# Patient Record
Sex: Male | Born: 1935 | Race: Black or African American | Hispanic: No | Smoking: Never smoker
Health system: Southern US, Community
[De-identification: ages and names within clinical notes are randomized; demographics above are authoritative.]

## PROBLEM LIST (undated history)

## (undated) DIAGNOSIS — K5190 Ulcerative colitis, unspecified, without complications: Secondary | ICD-10-CM

## (undated) DIAGNOSIS — J40 Bronchitis, not specified as acute or chronic: Secondary | ICD-10-CM

## (undated) DIAGNOSIS — N189 Chronic kidney disease, unspecified: Secondary | ICD-10-CM

## (undated) DIAGNOSIS — Z8719 Personal history of other diseases of the digestive system: Secondary | ICD-10-CM

## (undated) DIAGNOSIS — M1990 Unspecified osteoarthritis, unspecified site: Secondary | ICD-10-CM

## (undated) DIAGNOSIS — D481 Neoplasm of uncertain behavior of connective and other soft tissue: Secondary | ICD-10-CM

## (undated) DIAGNOSIS — D4819 Other specified neoplasm of uncertain behavior of connective and other soft tissue: Secondary | ICD-10-CM

## (undated) DIAGNOSIS — I10 Essential (primary) hypertension: Secondary | ICD-10-CM

## (undated) DIAGNOSIS — N4281 Prostatodynia syndrome: Secondary | ICD-10-CM

## (undated) HISTORY — PX: TOOTH EXTRACTION: SUR596

---

## 1998-05-02 ENCOUNTER — Emergency Department (HOSPITAL_COMMUNITY): Admission: EM | Admit: 1998-05-02 | Discharge: 1998-05-02 | Payer: Self-pay | Admitting: Emergency Medicine

## 1998-12-27 ENCOUNTER — Ambulatory Visit (HOSPITAL_COMMUNITY): Admission: RE | Admit: 1998-12-27 | Discharge: 1998-12-27 | Payer: Self-pay | Admitting: Gastroenterology

## 2000-04-21 ENCOUNTER — Ambulatory Visit (HOSPITAL_COMMUNITY): Admission: RE | Admit: 2000-04-21 | Discharge: 2000-04-21 | Payer: Self-pay | Admitting: Gastroenterology

## 2003-07-23 ENCOUNTER — Ambulatory Visit (HOSPITAL_COMMUNITY): Admission: RE | Admit: 2003-07-23 | Discharge: 2003-07-23 | Payer: Self-pay | Admitting: Internal Medicine

## 2003-07-23 ENCOUNTER — Encounter: Payer: Self-pay | Admitting: Internal Medicine

## 2005-08-07 ENCOUNTER — Ambulatory Visit (HOSPITAL_COMMUNITY): Admission: RE | Admit: 2005-08-07 | Discharge: 2005-08-07 | Payer: Self-pay | Admitting: Internal Medicine

## 2006-05-11 ENCOUNTER — Ambulatory Visit: Payer: Self-pay | Admitting: Gastroenterology

## 2008-12-20 ENCOUNTER — Encounter (INDEPENDENT_AMBULATORY_CARE_PROVIDER_SITE_OTHER): Payer: Self-pay | Admitting: *Deleted

## 2009-04-14 ENCOUNTER — Observation Stay (HOSPITAL_COMMUNITY): Admission: EM | Admit: 2009-04-14 | Discharge: 2009-04-15 | Payer: Self-pay | Admitting: Emergency Medicine

## 2009-04-28 ENCOUNTER — Inpatient Hospital Stay (HOSPITAL_COMMUNITY): Admission: EM | Admit: 2009-04-28 | Discharge: 2009-04-30 | Payer: Self-pay | Admitting: Emergency Medicine

## 2009-12-06 ENCOUNTER — Telehealth: Payer: Self-pay | Admitting: Gastroenterology

## 2010-01-30 ENCOUNTER — Ambulatory Visit: Payer: Self-pay | Admitting: Cardiology

## 2010-01-30 ENCOUNTER — Emergency Department (HOSPITAL_COMMUNITY): Admission: EM | Admit: 2010-01-30 | Discharge: 2010-02-01 | Payer: Self-pay | Admitting: Emergency Medicine

## 2010-01-30 ENCOUNTER — Inpatient Hospital Stay (HOSPITAL_COMMUNITY): Admission: EM | Admit: 2010-01-30 | Discharge: 2010-02-01 | Payer: Self-pay | Admitting: Emergency Medicine

## 2010-02-01 ENCOUNTER — Encounter (INDEPENDENT_AMBULATORY_CARE_PROVIDER_SITE_OTHER): Payer: Self-pay | Admitting: Internal Medicine

## 2010-02-20 ENCOUNTER — Telehealth: Payer: Self-pay | Admitting: Cardiology

## 2010-02-20 ENCOUNTER — Ambulatory Visit: Payer: Self-pay | Admitting: Cardiology

## 2010-02-20 DIAGNOSIS — R079 Chest pain, unspecified: Secondary | ICD-10-CM

## 2010-02-20 DIAGNOSIS — I10 Essential (primary) hypertension: Secondary | ICD-10-CM | POA: Insufficient documentation

## 2010-09-04 ENCOUNTER — Inpatient Hospital Stay (HOSPITAL_COMMUNITY): Admission: EM | Admit: 2010-09-04 | Discharge: 2010-09-06 | Payer: Self-pay | Admitting: Emergency Medicine

## 2010-09-04 ENCOUNTER — Ambulatory Visit: Payer: Self-pay | Admitting: Cardiology

## 2010-09-05 ENCOUNTER — Encounter (INDEPENDENT_AMBULATORY_CARE_PROVIDER_SITE_OTHER): Payer: Self-pay | Admitting: Internal Medicine

## 2010-11-06 NOTE — Progress Notes (Signed)
----   Converted from flag ---- ---- 02/20/2010 12:05 PM, Tera Mater, CNA wrote: 02/20/10 PATIENT WILL CALL WHEN READY TO [name]  ---- 02/20/2010 11:18 AM, Katina Dung, RN, BSN wrote: The following orders have been entered for this patient and placed on Admin Hold:  Type:     Referral       Code:   Nuc Stress Test Description:   Nuclear Stress Test Order Date:   02/20/2010   Authorized By:   Marca Ancona, MD Order #:   [id number] Clinical Notes:   _x__Exercise Stress ___Adenosine ___Lexiscan ___Dobutamine ___Myocardial Viability-Thallium  Prioity___1(next day) _x__2(one week) ___3 (prn)  __x__1 Day Protocol ____2 Day Protocol ------------------------------

## 2010-11-06 NOTE — Assessment & Plan Note (Signed)
Summary: NP6/History MI-mb   Primary Provider:  Dr. Margaretmary Bayley  CC:  new patient.  Pt is not sure that he has ever had heart problems.  .  History of Present Illness: 77 yo with history of HTN, borderline diabetes, and CKD presents for evaluation of chest pain.  Patient went to the ER in April with chest pain.  He felt "peculiar" all across his chest.  The sensation was somewhat pleuritic and was associated with a cough.  He had felt "bad" for 2-3 days prior.  Pain was not exertional.  It was somewhat prolonged (seems to have lasted for most of a day).  Cardiac enzymes, ECG, and CXR were unremarkable in the ER.  Since that time, patient has had on and off slight chest discomfort.  It is not exertional.  Patient does not report any significant exertional dyspnea.  He is able to walk 200 yards to his mailbox and back without problem.  Patient's BP is 159/82 today.  He did not take his metoprolol and says that he was nervous about coming here.  He takes BP at home and it consistently runs < 140/80.    Labs (4/11): creatinine 1.4, BNP < 30, cardiac enzymes negative, LDL 124, HDL 43, TGs 162  ECG: NSR, rSR' in V1 (incomplete RBBB)  Current Medications (verified): 1)  Centrum Silver  Tabs (Multiple Vitamins-Minerals) .... Once Daily 2)  Aspirin 81 Mg Tabs (Aspirin) .... Once Daily 3)  Metoprolol Tartrate 25 Mg Tabs (Metoprolol Tartrate) .... Take One Tablet Two Times A Day 4)  Tamsulosin Hcl 0.4 Mg Caps (Tamsulosin Hcl) .... Once Daily  Allergies (verified): No Known Drug Allergies  Past History:  Past Medical History: 1. Hypertension 2. BPH 3. Borderline diabetes 4. CKD: Last creatinine 1.4 5. Echo (4/11): EF 70-75% with trivial aortic insufficiency  Family History: No premature CAD  Social History: The patient denies smoking, alcohol or any drug use.  He currently lives at home with his disabled wife in [place] and is functional with his ADLs.  Retired Runner, broadcasting/film/video  Review of  Systems       All systems reviewed and negative except as per HPI.   Vital Signs:  Patient profile:   77 year old male Height:      66 inches Weight:      177 pounds BMI:     28.67 Pulse rate:   73 / minute Pulse rhythm:   irregular BP sitting:   159 / 82  (left arm) Cuff size:   regular  Vitals Entered By: Judithe Modest CMA (Feb 20, 2010 10:27 AM)  Physical Exam  General:  Well developed, well nourished, in no acute distress. Head:  normocephalic and atraumatic Nose:  no deformity, discharge, inflammation, or lesions Mouth:  Teeth, gums and palate normal. Oral mucosa normal. Neck:  Neck supple, no JVD. No masses, thyromegaly or abnormal cervical nodes. Lungs:  Clear bilaterally to auscultation and percussion. Heart:  Non-displaced PMI, chest non-tender; regular rate and rhythm, S1, S2 without murmurs, rubs. +S4. Carotid upstroke normal, no bruit.  Pedals normal pulses. No edema, no varicosities. Abdomen:  Bowel sounds positive; abdomen soft and non-tender without masses, organomegaly, or hernias noted. No hepatosplenomegaly. Extremities:  No clubbing or cyanosis. Neurologic:  Alert and oriented x 3. Skin:  Intact without lesions or rashes. Psych:  Normal affect.   Impression & Recommendations:  Problem # 1:  CHEST PAIN (ICD-786.50) Atypical chest pain prompting admission for rule out MI in 4/11.  He continues to have mild episodes of chest pain. I suspect that the admission in 4/11 may have been due to acute bronchitis/URI.  He does have risk factors for CAD including age and HTN.  He will continue ASA 81 mg daily.  I will set him up for ETT-myoview to risk stratify.    Problem # 2:  HYPERTENSION, UNSPECIFIED (ICD-401.9) BP is high today but patient has not taken his metoprolol.  Will continue to monitor, he says that his BP is always < 140/90 at home.   Other Orders: Nuclear Stress Test (Nuc Stress Test)  Patient Instructions: 1)  Your physician has requested that you  have an exercise stress myoview.  For further information please visit [email or web address]  Please follow instruction sheet, as given. 2)  Your physician recommends that you schedule a follow-up appointment as needed with Dr Shirlee Latch.

## 2010-11-06 NOTE — Progress Notes (Signed)
Summary: Schedule recall REV  Phone Note Outgoing Call Call back at [facility] Phone [phone]   Call placed by: Christie Nottingham CMA Duncan Dull),  December 06, 2009 11:14 AM Call placed to: Patient Summary of Call: Called pt to schedule him for a office visit for recall colon due to his age. Pt states she has too many appts for his wife at this time and needs to call our office back in the future to schedule this appt. I informed him of the importance of having this office visit and recall colonoscopy. Pt verbalized understanding and agreed to call us back at a later time.  Initial call taken by: Christie Nottingham CMA Duncan Dull),  December 06, 2009 11:16 AM

## 2010-12-16 LAB — DIFFERENTIAL
Basophils Absolute: 0 10*3/uL (ref 0.0–0.1)
Basophils Relative: 0 % (ref 0–1)
Monocytes Absolute: 0.7 10*3/uL (ref 0.1–1.0)
Neutro Abs: 12.1 10*3/uL — ABNORMAL HIGH (ref 1.7–7.7)

## 2010-12-16 LAB — COMPREHENSIVE METABOLIC PANEL
ALT: 17 U/L (ref 0–53)
AST: 21 U/L (ref 0–37)
Albumin: 3.8 g/dL (ref 3.5–5.2)
Alkaline Phosphatase: 64 U/L (ref 39–117)
BUN: 13 mg/dL (ref 6–23)
CO2: 25 mEq/L (ref 19–32)
CO2: 27 mEq/L (ref 19–32)
Calcium: 8.7 mg/dL (ref 8.4–10.5)
Chloride: 106 mEq/L (ref 96–112)
GFR calc Af Amer: 49 mL/min — ABNORMAL LOW (ref >60)
Glucose, Bld: 123 mg/dL — ABNORMAL HIGH (ref 70–99)
Potassium: 3.7 mEq/L (ref 3.5–5.1)
Potassium: 3.9 mEq/L (ref 3.5–5.1)
Sodium: 143 mEq/L (ref 135–145)
Total Bilirubin: 1 mg/dL (ref 0.3–1.2)
Total Protein: 6.1 g/dL (ref 6.0–8.3)

## 2010-12-16 LAB — CULTURE, BLOOD (ROUTINE X 2): Culture  Setup Time: 201112020142

## 2010-12-16 LAB — POCT CARDIAC MARKERS
Myoglobin, poc: 186 ng/mL (ref 12–200)
Troponin i, poc: <0.05 ng/mL (ref 0.00–0.09)

## 2010-12-16 LAB — URINE CULTURE
Colony Count: NO GROWTH
Culture  Setup Time: 201112012330

## 2010-12-16 LAB — URINALYSIS, ROUTINE W REFLEX MICROSCOPIC
Bilirubin Urine: NEGATIVE
Glucose, UA: NEGATIVE mg/dL
Ketones, ur: 15 mg/dL — AB
Nitrite: NEGATIVE
pH: 6 (ref 5.0–8.0)

## 2010-12-16 LAB — CBC
HCT: 42.6 % (ref 39.0–52.0)
Hemoglobin: 14.1 g/dL (ref 13.0–17.0)
Hemoglobin: 14.8 g/dL (ref 13.0–17.0)
MCHC: 33.8 g/dL (ref 30.0–36.0)
MCV: 92.2 fL (ref 78.0–100.0)
RBC: 4.62 MIL/uL (ref 4.22–5.81)
WBC: 10.1 10*3/uL (ref 4.0–10.5)
WBC: 13.5 10*3/uL — ABNORMAL HIGH (ref 4.0–10.5)

## 2010-12-16 LAB — GLUCOSE, CAPILLARY
Glucose-Capillary: 118 mg/dL — ABNORMAL HIGH (ref 70–99)
Glucose-Capillary: 93 mg/dL (ref 70–99)

## 2010-12-16 LAB — BASIC METABOLIC PANEL
BUN: 13 mg/dL (ref 6–23)
CO2: 28 mEq/L (ref 19–32)
Calcium: 8.6 mg/dL (ref 8.4–10.5)
Creatinine, Ser: 1.6 mg/dL — ABNORMAL HIGH (ref 0.4–1.5)
Glucose, Bld: 95 mg/dL (ref 70–99)

## 2010-12-16 LAB — CARDIAC PANEL(CRET KIN+CKTOT+MB+TROPI)
CK, MB: 2.2 ng/mL (ref 0.3–4.0)
Relative Index: 0.8 (ref 0.0–2.5)
Troponin I: 0.01 ng/mL (ref 0.00–0.06)

## 2010-12-16 LAB — APTT: aPTT: 27 seconds (ref 24–37)

## 2010-12-16 LAB — CREATININE, URINE, RANDOM: Creatinine, Urine: 199.5 mg/dL

## 2010-12-23 LAB — BASIC METABOLIC PANEL
BUN: 14 mg/dL (ref 6–23)
BUN: 15 mg/dL (ref 6–23)
CO2: 26 mEq/L (ref 19–32)
Calcium: 9 mg/dL (ref 8.4–10.5)
Chloride: 110 mEq/L (ref 96–112)
Creatinine, Ser: 1.43 mg/dL (ref 0.4–1.5)
Creatinine, Ser: 1.55 mg/dL — ABNORMAL HIGH (ref 0.4–1.5)
GFR calc Af Amer: 57 mL/min — ABNORMAL LOW (ref >60)
GFR calc non Af Amer: 43 mL/min — ABNORMAL LOW (ref >60)
GFR calc non Af Amer: 51 mL/min — ABNORMAL LOW (ref >60)
Glucose, Bld: 102 mg/dL — ABNORMAL HIGH (ref 70–99)
Potassium: 3.8 mEq/L (ref 3.5–5.1)
Potassium: 4 mEq/L (ref 3.5–5.1)
Sodium: 141 mEq/L (ref 135–145)
Sodium: 142 mEq/L (ref 135–145)

## 2010-12-23 LAB — CARDIAC PANEL(CRET KIN+CKTOT+MB+TROPI)
CK, MB: 4.6 ng/mL — ABNORMAL HIGH (ref 0.3–4.0)
Total CK: 265 U/L — ABNORMAL HIGH (ref 7–232)
Troponin I: 0.01 ng/mL (ref 0.00–0.06)

## 2010-12-23 LAB — URINALYSIS, ROUTINE W REFLEX MICROSCOPIC
Glucose, UA: NEGATIVE mg/dL
Ketones, ur: NEGATIVE mg/dL
Nitrite: NEGATIVE
Protein, ur: NEGATIVE mg/dL
pH: 6 (ref 5.0–8.0)

## 2010-12-23 LAB — CBC
HCT: 41.3 % (ref 39.0–52.0)
HCT: 43.8 % (ref 39.0–52.0)
Hemoglobin: 14 g/dL (ref 13.0–17.0)
Hemoglobin: 15 g/dL (ref 13.0–17.0)
MCHC: 33.9 g/dL (ref 30.0–36.0)
Platelets: 208 10*3/uL (ref 150–400)
RDW: 13.4 % (ref 11.5–15.5)
WBC: 7.5 10*3/uL (ref 4.0–10.5)

## 2010-12-23 LAB — DIFFERENTIAL
Basophils Absolute: 0 10*3/uL (ref 0.0–0.1)
Eosinophils Absolute: 0.1 10*3/uL (ref 0.0–0.7)
Lymphs Abs: 2.5 10*3/uL (ref 0.7–4.0)
Neutrophils Relative %: 60 % (ref 43–77)

## 2010-12-23 LAB — SODIUM, URINE, RANDOM: Sodium, Ur: 99 mEq/L

## 2010-12-23 LAB — LIPID PANEL: Triglycerides: 162 mg/dL — ABNORMAL HIGH (ref <150)

## 2010-12-23 LAB — GLUCOSE, CAPILLARY
Glucose-Capillary: 127 mg/dL — ABNORMAL HIGH (ref 70–99)
Glucose-Capillary: 128 mg/dL — ABNORMAL HIGH (ref 70–99)

## 2010-12-23 LAB — HEMOGLOBIN A1C
Hgb A1c MFr Bld: 6.4 % — ABNORMAL HIGH (ref <5.7)
Mean Plasma Glucose: 137 mg/dL — ABNORMAL HIGH (ref <117)

## 2010-12-23 LAB — URINE CULTURE: Culture: NO GROWTH

## 2010-12-23 LAB — TROPONIN I: Troponin I: 0.01 ng/mL (ref 0.00–0.06)

## 2010-12-23 LAB — D-DIMER, QUANTITATIVE: D-Dimer, Quant: 0.34 ug/mL-FEU (ref 0.00–0.48)

## 2010-12-23 LAB — BRAIN NATRIURETIC PEPTIDE: Pro B Natriuretic peptide (BNP): <30 pg/mL (ref 0.0–100.0)

## 2010-12-23 LAB — CK TOTAL AND CKMB (NOT AT ARMC): Relative Index: 1.7 (ref 0.0–2.5)

## 2011-01-11 LAB — HEMOCCULT GUIAC POC 1CARD (OFFICE): Fecal Occult Bld: NEGATIVE

## 2011-01-11 LAB — COMPREHENSIVE METABOLIC PANEL
ALT: 16 U/L (ref 0–53)
ALT: 19 U/L (ref 0–53)
AST: 23 U/L (ref 0–37)
Albumin: 3.6 g/dL (ref 3.5–5.2)
Alkaline Phosphatase: 60 U/L (ref 39–117)
BUN: 13 mg/dL (ref 6–23)
BUN: 13 mg/dL (ref 6–23)
CO2: 23 mEq/L (ref 19–32)
CO2: 27 mEq/L (ref 19–32)
CO2: 27 mEq/L (ref 19–32)
CO2: 27 mEq/L (ref 19–32)
Calcium: 8.2 mg/dL — ABNORMAL LOW (ref 8.4–10.5)
Calcium: 8.7 mg/dL (ref 8.4–10.5)
Chloride: 106 mEq/L (ref 96–112)
Chloride: 108 mEq/L (ref 96–112)
Creatinine, Ser: 1.35 mg/dL (ref 0.4–1.5)
Creatinine, Ser: 1.39 mg/dL (ref 0.4–1.5)
Creatinine, Ser: 1.45 mg/dL (ref 0.4–1.5)
GFR calc Af Amer: 56 mL/min — ABNORMAL LOW (ref >60)
GFR calc Af Amer: >60 mL/min (ref >60)
GFR calc non Af Amer: 46 mL/min — ABNORMAL LOW (ref >60)
GFR calc non Af Amer: 49 mL/min — ABNORMAL LOW (ref >60)
GFR calc non Af Amer: 50 mL/min — ABNORMAL LOW (ref >60)
GFR calc non Af Amer: 52 mL/min — ABNORMAL LOW (ref >60)
Glucose, Bld: 107 mg/dL — ABNORMAL HIGH (ref 70–99)
Glucose, Bld: 120 mg/dL — ABNORMAL HIGH (ref 70–99)
Glucose, Bld: 127 mg/dL — ABNORMAL HIGH (ref 70–99)
Glucose, Bld: 148 mg/dL — ABNORMAL HIGH (ref 70–99)
Potassium: 4.3 mEq/L (ref 3.5–5.1)
Sodium: 142 mEq/L (ref 135–145)
Total Bilirubin: 1.3 mg/dL — ABNORMAL HIGH (ref 0.3–1.2)
Total Bilirubin: 1.6 mg/dL — ABNORMAL HIGH (ref 0.3–1.2)

## 2011-01-11 LAB — CBC
HCT: 38.5 % — ABNORMAL LOW (ref 39.0–52.0)
HCT: 42.2 % (ref 39.0–52.0)
HCT: 43.1 % (ref 39.0–52.0)
HCT: 45.2 % (ref 39.0–52.0)
Hemoglobin: 13.1 g/dL (ref 13.0–17.0)
Hemoglobin: 14.3 g/dL (ref 13.0–17.0)
Hemoglobin: 14.5 g/dL (ref 13.0–17.0)
Hemoglobin: 15.5 g/dL (ref 13.0–17.0)
Hemoglobin: 15.6 g/dL (ref 13.0–17.0)
MCHC: 33.9 g/dL (ref 30.0–36.0)
MCHC: 33.9 g/dL (ref 30.0–36.0)
MCHC: 34.4 g/dL (ref 30.0–36.0)
MCHC: 34.5 g/dL (ref 30.0–36.0)
MCV: 94.5 fL (ref 78.0–100.0)
MCV: 94.5 fL (ref 78.0–100.0)
MCV: 94.8 fL (ref 78.0–100.0)
MCV: 94.9 fL (ref 78.0–100.0)
MCV: 95.1 fL (ref 78.0–100.0)
MCV: 95.2 fL (ref 78.0–100.0)
Platelets: 208 10*3/uL (ref 150–400)
RBC: 4.05 MIL/uL — ABNORMAL LOW (ref 4.22–5.81)
RBC: 4.44 MIL/uL (ref 4.22–5.81)
RBC: 4.56 MIL/uL (ref 4.22–5.81)
RBC: 4.75 MIL/uL (ref 4.22–5.81)
RBC: 4.77 MIL/uL (ref 4.22–5.81)
WBC: 12.4 10*3/uL — ABNORMAL HIGH (ref 4.0–10.5)
WBC: 8.6 10*3/uL (ref 4.0–10.5)

## 2011-01-11 LAB — DIFFERENTIAL
Basophils Absolute: 0 10*3/uL (ref 0.0–0.1)
Basophils Relative: 0 % (ref 0–1)
Basophils Relative: 0 % (ref 0–1)
Eosinophils Absolute: 0 10*3/uL (ref 0.0–0.7)
Eosinophils Absolute: 0.1 10*3/uL (ref 0.0–0.7)
Lymphocytes Relative: 17 % (ref 12–46)
Lymphocytes Relative: 24 % (ref 12–46)
Lymphocytes Relative: 5 % — ABNORMAL LOW (ref 12–46)
Lymphs Abs: 1.4 10*3/uL (ref 0.7–4.0)
Lymphs Abs: 2.1 10*3/uL (ref 0.7–4.0)
Monocytes Absolute: 0.4 10*3/uL (ref 0.1–1.0)
Monocytes Absolute: 0.4 10*3/uL (ref 0.1–1.0)
Monocytes Relative: 5 % (ref 3–12)
Neutro Abs: 11.4 10*3/uL — ABNORMAL HIGH (ref 1.7–7.7)
Neutro Abs: 5.3 10*3/uL (ref 1.7–7.7)
Neutro Abs: 5.5 10*3/uL (ref 1.7–7.7)
Neutrophils Relative %: 64 % (ref 43–77)
Neutrophils Relative %: 75 % (ref 43–77)

## 2011-01-11 LAB — POCT I-STAT, CHEM 8
BUN: 16 mg/dL (ref 6–23)
Chloride: 105 mEq/L (ref 96–112)
HCT: 43 % (ref 39.0–52.0)
Potassium: 4.6 mEq/L (ref 3.5–5.1)

## 2011-01-11 LAB — URINALYSIS, ROUTINE W REFLEX MICROSCOPIC
Bilirubin Urine: NEGATIVE
Ketones, ur: NEGATIVE mg/dL
Leukocytes, UA: NEGATIVE
Nitrite: NEGATIVE
Specific Gravity, Urine: 1.021 (ref 1.005–1.030)
Urobilinogen, UA: 0.2 mg/dL (ref 0.0–1.0)

## 2011-01-11 LAB — CULTURE, BLOOD (ROUTINE X 2): Culture: NO GROWTH

## 2011-01-11 LAB — URINE CULTURE: Culture: NO GROWTH

## 2011-01-11 LAB — LIPID PANEL
Cholesterol: 142 mg/dL (ref 0–200)
HDL: 32 mg/dL — ABNORMAL LOW (ref >39)

## 2011-01-11 LAB — BASIC METABOLIC PANEL
CO2: 26 mEq/L (ref 19–32)
Chloride: 104 mEq/L (ref 96–112)
GFR calc Af Amer: >60 mL/min (ref >60)
Sodium: 138 mEq/L (ref 135–145)

## 2011-01-11 LAB — URINE MICROSCOPIC-ADD ON

## 2011-01-11 LAB — PSA: PSA: 3.04 ng/mL (ref 0.10–4.00)

## 2011-01-11 LAB — SEDIMENTATION RATE: Sed Rate: 14 mm/hr (ref 0–16)
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
## 2011-02-20 NOTE — Discharge Summary (Signed)
NAME:  SILVANO, GAROFANO              ACCOUNT NO.:  [id number]   MEDICAL RECORD NO.:  [id number]          PATIENT TYPE:  INP   LOCATION:  1313                         FACILITY:  [facility]   PHYSICIAN:  Margaretmary Bayley, M.D.    DATE OF BIRTH:  10/21/1921   DATE OF ADMISSION:  04/28/2009  DATE OF DISCHARGE:  04/30/2009                               DISCHARGE SUMMARY   DISCHARGE DIAGNOSES:  1. Fever of undetermined etiology.  2. Malaise and fatigue.  3. Systemic hypertension.  4. Glucose intolerance.  5. Benign hepatic and renal cysts.  6. History of benign prostatic hypertrophy.  7. History of gouty arthropathy.   REASON FOR ADMISSION:  Mr. Commons is an 77 year old African American  gentleman who is brought into the ER by EMS called by his family because  of confusion, lethargy, and an elevation in his temperature.  According  to family members, the patient was very active and up each morning, was  lethargic and refused to get out of bed.  He experienced an episode of  vomiting.   The patient was brought into the ER where he was noted to be febrile to  102.9 with an elevation in his white blood cell count and was  subsequently admitted for further diagnostic evaluation and treatment.   PERTINENT PHYSICAL FINDINGS:  CONSTITUTIONAL:  He is a well-developed  elderly gentleman looking younger than his stated age of 77.  VITAL SIGNS:  Temperature 102, pulse 100, respiratory rate 20 and  unlabored, and blood pressure 115/65.  HEENT:  Sclerae clear.  No conjunctival pallor.  NECK:  Supple.  No adenopathy.  No thyroid enlargement.  CHEST:  There was no splinting.  No focal tenderness.  LUNGS:  Clear.  CARDIAC:  His precordium was 2+ dynamic.  Heart sounds were normal.  No  murmurs, rubs, or gallops.  ABDOMEN:  Soft, nontender.  No focal organ enlargement.  Bowel sounds  were normal.  GENITOURINARY:  No penile discharge.  No adenopathy.  EXTREMITIES:  No cyanosis, clubbing, or edema.  No  focal joint  inflammatory changes.  Pulses were 1+ and 2+ at the dorsalis and  posterior tibialis bilaterally.  BACK:  There is no spinal or CVA tenderness.  NEUROLOGIC:  He was a little confused, but oriented to name, place, and  situation.  No hallucinations.  There were no focal sensorimotor or  reflex deficits.   PERTINENT LAB AND X-RAY DATA:  The admission white count was elevated at  12,400 with a hemoglobin of 14.5 and a hematocrit of 43.  He had a left  shift.  Admission sodium, potassium chloride, CO2, BUN, calcium, total  protein, albumin, SGOT, SGPT were all normal.  He did have a slight  elevation in blood glucose at 148.  Creatinine 1.36.  His urinalysis,  urine was slightly cloudy with specific gravity of 1.021, negative for  glucose, small amount of hemoglobin, negative bilirubin, ketones,  protein, nitrites, and leukocyte esterase.  He had a few red cells per  high power field.  No white cells and no bacteria.  A CT brain scan  without contrast  revealed a normal size ventricular system.  There was  some moderate cortical atrophy and moderate cerebellar atrophy.  No  masses were seen.  No midline shift.  No hemorrhages, hematomas, or  abnormalities were noted.  Chest x-ray likewise showed clear lung  fields.  No cardiac enlargement or vascular congestion.  No evidence of  pulmonary edema, lung effusions, or pneumothoraces.   HOSPITAL COURSE:  The patient was admitted with a diagnosis of acute  febrile illness of uncertain etiology.  Cultures of his blood and urine  were obtained, and he was empirically started on a broad-spectrum  antibiotics, Rocephin 1 g IV daily.  The patient was afebrile within the  first 24 hours and much more alert and oriented.  His cultures all  subsequently returned normal with a normal sedimentation rate.  Going  back and looking at the patient's recent admission to [facility] back in  the early part of July where it was noted that the patient  on CT scan  revealed some nonspecific lesions, which were nondiagnostic.  An MRI at  that time was recommended to clarify these areas.  He was taken down for  an MRI and this subsequently revealed benign cystic lesions in both the  right kidney and the liver.  The patient was gradually gotten up and  active without any untoward effects.  Due to the fact that he had no  obvious cause for his fever but seemed to be fairly healthy and without  complaints, he was discharged on an empiric course of Doxycycline 100 mg  b.i.d. for the next week, and was scheduled to be seen by me in 10 days  post discharge.  The patient was instructed to call with any change in  symptomatology or any new complaints.  His discharge condition is  significantly improved.  Prognosis is considered to be good.  He is to  resume his metoprolol 25 mg twice a day for blood pressure, Flomax 0.4  mg at bedtime for his BPH, Ecotrin 81 mg per day, and as noted above,  the Doxycycline 100 mg twice a day for 7 days.  He is discharged on a 3  g sodium heart-healthy diet.      Margaretmary Bayley, M.D.  Electronically Signed     PC/MEDQ  D:  05/15/2009  T:  05/15/2009  Job:  [id number]

## 2011-02-20 NOTE — Procedures (Signed)
[facility]  Patient:    Jerry Berry, Jerry Berry                       MRN: [id number] Proc. Date: 04/21/00 Adm. Date:  [id number] Attending:  Judeth Cornfield                           Procedure Report  PROCEDURE:  Sigmoidoscopy.  ENDOSCOPIST:  Barbette Hair. Arlyce Dice, M.D.  INDICATIONS:  Mr. Beitler has had recent onset of rectal bleeding.  He has a history of diverticulosis.  The bleeding has been limited.  This test is performed for further evaluation.  INFORMED CONSENT:  The patient provided consent, after the risks, benefits, and  alternatives were explained.  MEDICATIONS:  None.  DESCRIPTION OF PROCEDURE:  The Olympus video colonoscope was inserted to 60.0 cm, corresponding to the proximal descending colon.  FINDINGS: 1. There are several internal hemorrhoids seen on retroflexed view of the    rectal vault. 2. There are multiple wide-mouthed diverticula, beginning in the proximal    sigmoid and extending to the descending colon.  IMPRESSION: 1. Internal hemorrhoids. 2. Diverticulosis. 3. Rectal bleeding, probably secondary to number one.  RECOMMENDATIONS:  Anusol suppositories p.r.n. bleeding. DD:  04/21/00 TD:  04/22/00 Job: [id number]

## 2011-03-20 ENCOUNTER — Inpatient Hospital Stay (INDEPENDENT_AMBULATORY_CARE_PROVIDER_SITE_OTHER)
Admission: RE | Admit: 2011-03-20 | Discharge: 2011-03-20 | Disposition: A | Discharge status: Home / Self Care | Payer: Medicare Other | Source: Ambulatory Visit | Attending: Family Medicine | Admitting: Family Medicine

## 2011-03-20 DIAGNOSIS — K047 Periapical abscess without sinus: Secondary | ICD-10-CM

## 2011-10-08 ENCOUNTER — Other Ambulatory Visit: Payer: Self-pay | Admitting: Internal Medicine

## 2011-10-08 ENCOUNTER — Ambulatory Visit (HOSPITAL_COMMUNITY)
Admission: RE | Admit: 2011-10-08 | Discharge: 2011-10-08 | Disposition: A | Discharge status: Home / Self Care | Payer: Medicare Other | Source: Ambulatory Visit | Attending: Internal Medicine | Admitting: Internal Medicine

## 2011-10-08 DIAGNOSIS — R142 Eructation: Secondary | ICD-10-CM | POA: Insufficient documentation

## 2011-10-08 DIAGNOSIS — R109 Unspecified abdominal pain: Secondary | ICD-10-CM | POA: Insufficient documentation

## 2011-10-08 DIAGNOSIS — R05 Cough: Secondary | ICD-10-CM | POA: Insufficient documentation

## 2011-10-08 DIAGNOSIS — R141 Gas pain: Secondary | ICD-10-CM | POA: Insufficient documentation

## 2011-10-08 DIAGNOSIS — R059 Cough, unspecified: Secondary | ICD-10-CM | POA: Insufficient documentation

## 2011-12-17 ENCOUNTER — Ambulatory Visit (HOSPITAL_COMMUNITY)
Admission: RE | Admit: 2011-12-17 | Discharge: 2011-12-17 | Disposition: A | Discharge status: Home / Self Care | Payer: Medicare Other | Source: Ambulatory Visit | Attending: Internal Medicine | Admitting: Internal Medicine

## 2011-12-17 ENCOUNTER — Other Ambulatory Visit: Payer: Self-pay | Admitting: Internal Medicine

## 2011-12-17 DIAGNOSIS — R059 Cough, unspecified: Secondary | ICD-10-CM | POA: Insufficient documentation

## 2011-12-17 DIAGNOSIS — R509 Fever, unspecified: Secondary | ICD-10-CM | POA: Insufficient documentation

## 2011-12-17 DIAGNOSIS — R05 Cough: Secondary | ICD-10-CM | POA: Insufficient documentation

## 2012-02-13 ENCOUNTER — Inpatient Hospital Stay (HOSPITAL_COMMUNITY)
Admission: EM | Admit: 2012-02-13 | Discharge: 2012-02-18 | DRG: 193 | Disposition: A | Discharge status: Home / Self Care | Payer: Medicare Other | Attending: Internal Medicine | Admitting: Internal Medicine

## 2012-02-13 ENCOUNTER — Emergency Department (HOSPITAL_COMMUNITY): Payer: Medicare Other

## 2012-02-13 ENCOUNTER — Encounter (HOSPITAL_COMMUNITY): Payer: Self-pay

## 2012-02-13 DIAGNOSIS — G92 Toxic encephalopathy: Secondary | ICD-10-CM | POA: Diagnosis present

## 2012-02-13 DIAGNOSIS — I10 Essential (primary) hypertension: Secondary | ICD-10-CM | POA: Diagnosis present

## 2012-02-13 DIAGNOSIS — J189 Pneumonia, unspecified organism: Principal | ICD-10-CM

## 2012-02-13 DIAGNOSIS — N179 Acute kidney failure, unspecified: Secondary | ICD-10-CM

## 2012-02-13 DIAGNOSIS — D7289 Other specified disorders of white blood cells: Secondary | ICD-10-CM

## 2012-02-13 DIAGNOSIS — J159 Unspecified bacterial pneumonia: Secondary | ICD-10-CM

## 2012-02-13 DIAGNOSIS — R7881 Bacteremia: Secondary | ICD-10-CM | POA: Diagnosis present

## 2012-02-13 DIAGNOSIS — N400 Enlarged prostate without lower urinary tract symptoms: Secondary | ICD-10-CM | POA: Diagnosis present

## 2012-02-13 DIAGNOSIS — B965 Pseudomonas (aeruginosa) (mallei) (pseudomallei) as the cause of diseases classified elsewhere: Secondary | ICD-10-CM | POA: Diagnosis present

## 2012-02-13 DIAGNOSIS — R509 Fever, unspecified: Secondary | ICD-10-CM

## 2012-02-13 DIAGNOSIS — R079 Chest pain, unspecified: Secondary | ICD-10-CM | POA: Diagnosis present

## 2012-02-13 DIAGNOSIS — G929 Unspecified toxic encephalopathy: Secondary | ICD-10-CM | POA: Diagnosis present

## 2012-02-13 HISTORY — DX: Bronchitis, not specified as acute or chronic: J40

## 2012-02-13 HISTORY — DX: Other specified neoplasm of uncertain behavior of connective and other soft tissue: D48.19

## 2012-02-13 HISTORY — DX: Prostatodynia syndrome: N42.81

## 2012-02-13 HISTORY — DX: Neoplasm of uncertain behavior of connective and other soft tissue: D48.1

## 2012-02-13 HISTORY — DX: Essential (primary) hypertension: I10

## 2012-02-13 LAB — TROPONIN I: Troponin I: <0.3 ng/mL (ref <0.30)

## 2012-02-13 LAB — CBC
HCT: 40.9 % (ref 39.0–52.0)
Hemoglobin: 14 g/dL (ref 13.0–17.0)
MCH: 32 pg (ref 26.0–34.0)
MCHC: 34.2 g/dL (ref 30.0–36.0)
MCV: 93.6 fL (ref 78.0–100.0)
Platelets: 232 10*3/uL (ref 150–400)
RBC: 4.37 MIL/uL (ref 4.22–5.81)
RDW: 12.9 % (ref 11.5–15.5)
WBC: 10 10*3/uL (ref 4.0–10.5)

## 2012-02-13 LAB — BASIC METABOLIC PANEL
BUN: 18 mg/dL (ref 6–23)
CO2: 25 mEq/L (ref 19–32)
Calcium: 9.4 mg/dL (ref 8.4–10.5)
Chloride: 103 mEq/L (ref 96–112)
Creatinine, Ser: 1.43 mg/dL — ABNORMAL HIGH (ref 0.50–1.35)
GFR calc Af Amer: 49 mL/min — ABNORMAL LOW (ref >90)
GFR calc non Af Amer: 42 mL/min — ABNORMAL LOW (ref >90)
Glucose, Bld: 134 mg/dL — ABNORMAL HIGH (ref 70–99)
Potassium: 4.2 mEq/L (ref 3.5–5.1)
Sodium: 140 mEq/L (ref 135–145)

## 2012-02-13 LAB — URINALYSIS, ROUTINE W REFLEX MICROSCOPIC
Bilirubin Urine: NEGATIVE
Glucose, UA: NEGATIVE mg/dL
Hgb urine dipstick: NEGATIVE
Ketones, ur: NEGATIVE mg/dL
Leukocytes, UA: NEGATIVE
Nitrite: NEGATIVE
Protein, ur: NEGATIVE mg/dL
Specific Gravity, Urine: 1.017 (ref 1.005–1.030)
Urobilinogen, UA: 0.2 mg/dL (ref 0.0–1.0)
pH: 6.5 (ref 5.0–8.0)

## 2012-02-13 LAB — GLUCOSE, CAPILLARY: Glucose-Capillary: 140 mg/dL — ABNORMAL HIGH (ref 70–99)

## 2012-02-13 LAB — LACTIC ACID, PLASMA: Lactic Acid, Venous: 2 mmol/L (ref 0.5–2.2)

## 2012-02-13 MED ORDER — MORPHINE SULFATE 2 MG/ML IJ SOLN: 1.0000 mg | INTRAMUSCULAR | Status: DC | PRN

## 2012-02-13 MED ORDER — METOPROLOL TARTRATE 25 MG PO TABS: 25.0000 mg | ORAL_TABLET | Freq: Two times a day (BID) | ORAL | Status: DC

## 2012-02-13 MED ORDER — TAMSULOSIN HCL 0.4 MG PO CAPS
0.4000 mg | ORAL_CAPSULE | Freq: Every day | ORAL | Status: DC
  Administered 2012-02-13 – 2012-02-17 (×5): 0.4 mg via ORAL
  Filled 2012-02-13 (×7): qty 1

## 2012-02-13 MED ORDER — GUAIFENESIN ER 600 MG PO TB12
600.0000 mg | ORAL_TABLET | Freq: Two times a day (BID) | ORAL | Status: DC
  Administered 2012-02-13 – 2012-02-18 (×10): 600 mg via ORAL
  Filled 2012-02-13 (×12): qty 1

## 2012-02-13 MED ORDER — DEXTROSE 5 % IV SOLN
500.0000 mg | INTRAVENOUS | Status: DC
  Administered 2012-02-14 – 2012-02-17 (×4): 500 mg via INTRAVENOUS
  Filled 2012-02-13 (×5): qty 500

## 2012-02-13 MED ORDER — DEXTROSE 5 % IV SOLN: 500.0000 mg | INTRAVENOUS | Status: DC

## 2012-02-13 MED ORDER — DEXTROSE 5 % IV SOLN
1.0000 g | Freq: Once | INTRAVENOUS | Status: AC
  Administered 2012-02-13: 1 g via INTRAVENOUS
  Filled 2012-02-13: qty 10

## 2012-02-13 MED ORDER — DEXTROSE 5 % IV SOLN
500.0000 mg | Freq: Once | INTRAVENOUS | Status: AC
  Administered 2012-02-13: 500 mg via INTRAVENOUS
  Filled 2012-02-13: qty 500

## 2012-02-13 MED ORDER — SODIUM CHLORIDE 0.9 % IV BOLUS (SEPSIS)
1000.0000 mL | Freq: Once | INTRAVENOUS | Status: AC
  Administered 2012-02-13: 1000 mL via INTRAVENOUS

## 2012-02-13 MED ORDER — ASPIRIN 81 MG PO CHEW
81.0000 mg | CHEWABLE_TABLET | Freq: Every day | ORAL | Status: DC
  Administered 2012-02-14 – 2012-02-18 (×5): 81 mg via ORAL
  Filled 2012-02-13 (×5): qty 1

## 2012-02-13 MED ORDER — ACETAMINOPHEN 650 MG RE SUPP
RECTAL | Status: AC
  Administered 2012-02-13: 13:00:00
  Filled 2012-02-13: qty 1

## 2012-02-13 MED ORDER — SODIUM CHLORIDE 0.9 % IV SOLN: INTRAVENOUS | Status: DC

## 2012-02-13 MED ORDER — ONDANSETRON HCL 4 MG/2ML IJ SOLN: 4.0000 mg | Freq: Three times a day (TID) | INTRAMUSCULAR | Status: DC | PRN

## 2012-02-13 MED ORDER — ACETAMINOPHEN 325 MG PO TABS
650.0000 mg | ORAL_TABLET | Freq: Four times a day (QID) | ORAL | Status: DC | PRN
Start: 2012-02-13 — End: 2012-02-18
  Administered 2012-02-13: 650 mg via ORAL
  Filled 2012-02-13: qty 2

## 2012-02-13 MED ORDER — ACETAMINOPHEN 650 MG RE SUPP: 650.0000 mg | Freq: Four times a day (QID) | RECTAL | Status: DC | PRN

## 2012-02-13 MED ORDER — ALBUTEROL SULFATE (5 MG/ML) 0.5% IN NEBU: 2.5000 mg | INHALATION_SOLUTION | Freq: Four times a day (QID) | RESPIRATORY_TRACT | Status: DC | PRN

## 2012-02-13 MED ORDER — SODIUM CHLORIDE 0.9 % IV SOLN
INTRAVENOUS | Status: DC
  Administered 2012-02-13 – 2012-02-15 (×4): via INTRAVENOUS

## 2012-02-13 MED ORDER — ONDANSETRON HCL 4 MG PO TABS: 4.0000 mg | ORAL_TABLET | Freq: Four times a day (QID) | ORAL | Status: DC | PRN

## 2012-02-13 MED ORDER — ADULT MULTIVITAMIN W/MINERALS CH
1.0000 | ORAL_TABLET | Freq: Every day | ORAL | Status: DC
  Administered 2012-02-14 – 2012-02-18 (×5): 1 via ORAL
  Filled 2012-02-13 (×5): qty 1

## 2012-02-13 MED ORDER — ENOXAPARIN SODIUM 40 MG/0.4ML ~~LOC~~ SOLN
40.0000 mg | SUBCUTANEOUS | Status: DC
  Administered 2012-02-13 – 2012-02-17 (×5): 40 mg via SUBCUTANEOUS
  Filled 2012-02-13 (×7): qty 0.4

## 2012-02-13 MED ORDER — ACETAMINOPHEN 325 MG PO TABS
ORAL_TABLET | ORAL | Status: AC
  Administered 2012-02-13: 650 mg
  Filled 2012-02-13: qty 2

## 2012-02-13 MED ORDER — ONDANSETRON HCL 4 MG/2ML IJ SOLN: 4.0000 mg | Freq: Four times a day (QID) | INTRAMUSCULAR | Status: DC | PRN

## 2012-02-13 MED ORDER — DEXTROSE 5 % IV SOLN
1.0000 g | INTRAVENOUS | Status: DC
  Administered 2012-02-14: 1 g via INTRAVENOUS
  Filled 2012-02-13: qty 10

## 2012-02-13 MED ORDER — METOPROLOL TARTRATE 25 MG PO TABS
25.0000 mg | ORAL_TABLET | Freq: Two times a day (BID) | ORAL | Status: DC
  Administered 2012-02-14 – 2012-02-18 (×8): 25 mg via ORAL
  Filled 2012-02-13 (×10): qty 1

## 2012-02-13 NOTE — ED Provider Notes (Signed)
History    78yM with confusion and weakness. Gradual onset and worsening since about a day ago. Pt c/o chills. No fever. Denies pain anywhere. No SOB. No urinary complaints. No v/d. No rash. No recent trauma. No new med changes.   CSN: [id number]  Arrival date & time 02/13/12  1133   First MD Initiated Contact with Patient 02/13/12 1136      Chief Complaint  Patient presents with  . Weakness  . Dizziness    (Consider location/radiation/quality/duration/timing/severity/associated sxs/prior treatment) HPI  Past Medical History  Diagnosis Date  . Hypertension   . Prostate pain   . Bronchitis   . Abdominal desmoid tumor     History reviewed. No pertinent past surgical history.  History reviewed. No pertinent family history.  History  Substance Use Topics  . Smoking status: Never Smoker   . Smokeless tobacco: Not on file  . Alcohol Use: No      Review of Systems   Review of symptoms negative unless otherwise noted in HPI.   Allergies  Review of patient's allergies indicates no known allergies.  Home Medications  No current outpatient prescriptions on file.  BP 136/116  Pulse 107  Resp 18  SpO2 95%  Physical Exam  Nursing note and vitals reviewed. Constitutional: He appears well-developed and well-nourished. No distress.       Laying in bed. Tired appearing but not toxic. Warm to touch.  HENT:  Head: Normocephalic and atraumatic.  Right Ear: External ear normal.  Mouth/Throat: Oropharynx is clear and moist.  Eyes: Conjunctivae are normal. Pupils are equal, round, and reactive to light. Right eye exhibits no discharge. Left eye exhibits no discharge.  Neck: Neck supple.  Cardiovascular: Regular rhythm and normal heart sounds.  Exam reveals no gallop and no friction rub.   No murmur heard.      tachycardic  Pulmonary/Chest: Effort normal and breath sounds normal. No respiratory distress.  Abdominal: Soft. He exhibits no distension. There is no  tenderness.  Musculoskeletal: He exhibits no edema and no tenderness.       No appreciable exacerbating or relieving factors.    Lymphadenopathy:    He has no cervical adenopathy.  Neurological: He is alert.  Skin: Skin is warm and dry.  Psychiatric: He has a normal mood and affect. His behavior is normal. Thought content normal.    ED Course  Procedures (including critical care time)  Labs Reviewed  BASIC METABOLIC PANEL - Abnormal; Notable for the following:    Glucose, Bld 134 (*)    Creatinine, Ser 1.43 (*)    GFR calc non Af Amer 42 (*)    GFR calc Af Amer 49 (*)    All other components within normal limits  GLUCOSE, CAPILLARY - Abnormal; Notable for the following:    Glucose-Capillary 140 (*)    All other components within normal limits  CBC - Abnormal; Notable for the following:    WBC 11.2 (*)    RBC 3.93 (*)    Hemoglobin 12.3 (*)    HCT 36.9 (*)    All other components within normal limits  BASIC METABOLIC PANEL - Abnormal; Notable for the following:    Glucose, Bld 112 (*)    Creatinine, Ser 1.38 (*)    Calcium 8.0 (*)    GFR calc non Af Amer 44 (*)    GFR calc Af Amer 51 (*)    All other components within normal limits  CBC - Abnormal; Notable for the following:  RBC 4.02 (*)    Hemoglobin 12.4 (*)    HCT 37.6 (*)    All other components within normal limits  BASIC METABOLIC PANEL - Abnormal; Notable for the following:    Glucose, Bld 102 (*)    Creatinine, Ser 1.38 (*)    Calcium 8.2 (*)    GFR calc non Af Amer 44 (*)    GFR calc Af Amer 51 (*)    All other components within normal limits  CBC - Abnormal; Notable for the following:    RBC 3.80 (*)    Hemoglobin 12.0 (*)    HCT 35.2 (*)    All other components within normal limits  BASIC METABOLIC PANEL - Abnormal; Notable for the following:    Glucose, Bld 109 (*)    GFR calc non Af Amer 48 (*)    GFR calc Af Amer 55 (*)    All other components within normal limits  BASIC METABOLIC PANEL -  Abnormal; Notable for the following:    Glucose, Bld 115 (*)    Creatinine, Ser 1.43 (*)    GFR calc non Af Amer 42 (*)    GFR calc Af Amer 49 (*)    All other components within normal limits  URINALYSIS, ROUTINE W REFLEX MICROSCOPIC  URINE CULTURE  CULTURE, BLOOD (ROUTINE X 2)  CULTURE, BLOOD (ROUTINE X 2)  CBC  TROPONIN I  LACTIC ACID, PLASMA  HIV ANTIBODY (ROUTINE TESTING)  LEGIONELLA ANTIGEN, URINE  STREP PNEUMONIAE URINARY ANTIGEN  CULTURE, EXPECTORATED SPUTUM-ASSESSMENT  GRAM STAIN   Dg Chest 2 View  02/13/2012  *RADIOLOGY REPORT*  Clinical Data: Fever, chest pain, confusion  CHEST - 2 VIEW  Comparison: 12/17/2011  Findings: Cardiomediastinal silhouette is stable.  There is slight worsening streaky left basilar atelectasis or infiltrate especially on lateral view.  Early infiltrate cannot be excluded.  No pulmonary edema.  IMPRESSION: No pulmonary edema.  Slight worsening streaky left basilar atelectasis or infiltrate.  Original Report Authenticated By: Natasha Mead, M.D.   Ct Head Wo Contrast  02/13/2012  *RADIOLOGY REPORT*  Clinical Data: Weakness, dizziness, confusion  CT HEAD WITHOUT CONTRAST  Technique:  Contiguous axial images were obtained from the base of the skull through the vertex without contrast.  Comparison: 09/04/2010  Findings: No skull fracture is noted.  Paranasal sinuses and mastoid air cells are unremarkable.  No intracranial hemorrhage, mass effect or midline shift.  No acute cortical infarction.  No mass lesion is noted on this unenhanced scan.  Stable cerebral atrophy.  Stable periventricular and subcortical white matter decreased attenuation consistent with chronic small vessel ischemic changes.  IMPRESSION: No acute intracranial abnormality.  Stable atrophy and chronic white matter disease.  No definite acute cortical infarct.  Original Report Authenticated By: Natasha Mead, M.D.    EKG:  Rhythm: sinus tach Rate: 109 Axis: normal Intervals: normal ST segments:  NS ST changes. t wave flattening laterally and mild ST depression inferiorly but similar to previous.  1. CAP (community acquired pneumonia)   2. Fever       MDM  78 year old male with fever and confusion. Suspect confusion related to febrile illness. Patchy left-sided infiltrate. Will cover for possible community-acquired pneumonia. UA fine. Mental status improved to baseline. Nonfocal neuro exam and no meningeal signs. LP deferred. Admit for further tx and monitoring.       Raeford Razor, MD 02/17/12 [phone]

## 2012-02-13 NOTE — ED Notes (Signed)
Pt denies pain.

## 2012-02-13 NOTE — H&P (Signed)
PCP:   Laurena Slimmer, MD, MD   Chief Complaint:  Generalized malaise and weakness; fever/chills, cough and AMS  HPI: 78 y/o male with PMH of BPH, hiatal hernia and HTN; came from home due to AMS, generalized malaise and some fever/chills. Patient and family reported symptoms has been present for the last 1-2 days and worsening. They also endorses some cough. In the ED CXR demonstrated chronic bronchitic changes and LLL infiltrate; patient also found with fever 103, mild confusion and ARF.   CT scan of the head was negative and mentation improved after tylenol control his temp; [facility] called to admit patient for further evaluation and treatment.  Patient denies nausea, vomiting, HA's, vision changes; hematemesis, melena or hematochezia.  Allergies:  No Known Allergies    Past Medical History  Diagnosis Date  . Hypertension   . Prostate pain   . Bronchitis   . Abdominal desmoid tumor     History reviewed. No pertinent past surgical history.  Prior to Admission medications   Medication Sig Start Date End Date Taking? Authorizing Provider  aspirin 81 MG chewable tablet Chew 81 mg by mouth daily.   Yes Historical Provider, MD  metoprolol tartrate (LOPRESSOR) 25 MG tablet Take 25 mg by mouth 2 (two) times daily.   Yes Historical Provider, MD  Multiple Vitamin (MULITIVITAMIN WITH MINERALS) TABS Take 1 tablet by mouth daily.   Yes Historical Provider, MD  Tamsulosin HCl (FLOMAX) 0.4 MG CAPS Take 0.4 mg by mouth at bedtime.   Yes Historical Provider, MD    Social History:  reports that he has never smoked. He does not have any smokeless tobacco history on file. He reports that he does not drink alcohol or use illicit drugs.  History reviewed. No pertinent family history.  Review of Systems:  Negative except as mentioned on HPI.  Physical Exam: Blood pressure 143/85, pulse 118, temperature 100.5 F (38.1 C), temperature source Oral, resp. rate 18, SpO2 97.00%. Constitutional:  oriented to person and place; appears well-developed and well-nourished. Mild distress and shaking due to fever. Mild mucous membranes dryness.  Head: Normocephalic and atraumatic.  Eyes: PERRL, no icterus; no nystagmus; EOMI.  Neck: Neck supple.  Cardiovascular: tachycardic, no rubs, no murmurs, no gallops; no JVD.  Pulmonary/Chest: mild tachypnea, decreased BS at bases and diffuse rhonchi; no wheezing. No rales.  Abdominal: Soft. Bowel sounds are normal. No distension. Mild epigastric discomfort on palpation; no guarding. Musculoskeletal: He exhibits no edema and no tenderness.  Neurological: alert and oriented to person and place; CN intact; no focal deficit. MS 4/5 bilaterally.  Labs on Admission:  Results for orders placed during the hospital encounter of 02/13/12 (from the past 48 hour(s))  GLUCOSE, CAPILLARY     Status: Abnormal   Collection Time   02/13/12 11:48 AM      Component Value Range Comment   Glucose-Capillary 140 (*) 70 - 99 (mg/dL)    Comment 1 Notify RN      Comment 2 Documented in Chart     CBC     Status: Normal   Collection Time   02/13/12 11:57 AM      Component Value Range Comment   WBC 10.0  4.0 - 10.5 (K/uL)    RBC 4.37  4.22 - 5.81 (MIL/uL)    Hemoglobin 14.0  13.0 - 17.0 (g/dL)    HCT 16.1  09.6 - 04.5 (%)    MCV 93.6  78.0 - 100.0 (fL)    MCH 32.0  26.0 -  34.0 (pg)    MCHC 34.2  30.0 - 36.0 (g/dL)    RDW 16.1  09.6 - 04.5 (%)    Platelets 232  150 - 400 (K/uL)   BASIC METABOLIC PANEL     Status: Abnormal   Collection Time   02/13/12 11:57 AM      Component Value Range Comment   Sodium 140  135 - 145 (mEq/L)    Potassium 4.2  3.5 - 5.1 (mEq/L)    Chloride 103  96 - 112 (mEq/L)    CO2 25  19 - 32 (mEq/L)    Glucose, Bld 134 (*) 70 - 99 (mg/dL)    BUN 18  6 - 23 (mg/dL)    Creatinine, Ser 4.09 (*) 0.50 - 1.35 (mg/dL)    Calcium 9.4  8.4 - 10.5 (mg/dL)    GFR calc non Af Amer 42 (*) >90 (mL/min)    GFR calc Af Amer 49 (*) >90 (mL/min)   TROPONIN I      Status: Normal   Collection Time   02/13/12 11:57 AM      Component Value Range Comment   Troponin I <0.30  <0.30 (ng/mL)   LACTIC ACID, PLASMA     Status: Normal   Collection Time   02/13/12 11:57 AM      Component Value Range Comment   Lactic Acid, Venous 2.0  0.5 - 2.2 (mmol/L)   URINALYSIS, ROUTINE W REFLEX MICROSCOPIC     Status: Normal   Collection Time   02/13/12 12:11 PM      Component Value Range Comment   Color, Urine YELLOW  YELLOW     APPearance CLEAR  CLEAR     Specific Gravity, Urine 1.017  1.005 - 1.030     pH 6.5  5.0 - 8.0     Glucose, UA NEGATIVE  NEGATIVE (mg/dL)    Hgb urine dipstick NEGATIVE  NEGATIVE     Bilirubin Urine NEGATIVE  NEGATIVE     Ketones, ur NEGATIVE  NEGATIVE (mg/dL)    Protein, ur NEGATIVE  NEGATIVE (mg/dL)    Urobilinogen, UA 0.2  0.0 - 1.0 (mg/dL)    Nitrite NEGATIVE  NEGATIVE     Leukocytes, UA NEGATIVE  NEGATIVE  MICROSCOPIC NOT DONE ON URINES WITH NEGATIVE PROTEIN, BLOOD, LEUKOCYTES, NITRITE, OR GLUCOSE <1000 mg/dL.    Radiological Exams on Admission: Dg Chest 2 View  02/13/2012  *RADIOLOGY REPORT*  Clinical Data: Fever, chest pain, confusion  CHEST - 2 VIEW  Comparison: 12/17/2011  Findings: Cardiomediastinal silhouette is stable.  There is slight worsening streaky left basilar atelectasis or infiltrate especially on lateral view.  Early infiltrate cannot be excluded.  No pulmonary edema.  IMPRESSION: No pulmonary edema.  Slight worsening streaky left basilar atelectasis or infiltrate.  Original Report Authenticated By: Natasha Mead, M.D.   Ct Head Wo Contrast  02/13/2012  *RADIOLOGY REPORT*  Clinical Data: Weakness, dizziness, confusion  CT HEAD WITHOUT CONTRAST  Technique:  Contiguous axial images were obtained from the base of the skull through the vertex without contrast.  Comparison: 09/04/2010  Findings: No skull fracture is noted.  Paranasal sinuses and mastoid air cells are unremarkable.  No intracranial hemorrhage, mass effect or  midline shift.  No acute cortical infarction.  No mass lesion is noted on this unenhanced scan.  Stable cerebral atrophy.  Stable periventricular and subcortical white matter decreased attenuation consistent with chronic small vessel ischemic changes.  IMPRESSION: No acute intracranial abnormality.  Stable atrophy and chronic  white matter disease.  No definite acute cortical infarct.  Original Report Authenticated By: Natasha Mead, M.D.     Assessment/Plan 1-CAP (community acquired pneumonia): admit to regular floor; IV antibiotics; supportive care and follow culture. PRN antipyretics and also antiemetics.  2-AMS: most likely toxic metabolic encephalopathy due to fever and PNA. Will provide supportive care and follow clinical response.  3-HYPERTENSION, UNSPECIFIED:continue home regimen. BP stable.  4-Fever and chills: due to #1; continue IV antibiotics.  5-ARF (acute renal failure): most likely due to pre-renal azotemia. Will give IVF's and follow BMET in am.  6-BPH (benign prostatic hyperplasia): continue flomax.  7-DVT: lovenox.   Time Spent on Admission: 50 minutes  Montrey Buist [facility] [phone]  02/13/2012, 6:22 PM

## 2012-02-13 NOTE — ED Notes (Signed)
Pt in from home with weakness and confusion family states difficulty walking state acute onset of chills

## 2012-02-14 ENCOUNTER — Inpatient Hospital Stay (HOSPITAL_COMMUNITY): Payer: Medicare Other

## 2012-02-14 DIAGNOSIS — J159 Unspecified bacterial pneumonia: Secondary | ICD-10-CM

## 2012-02-14 DIAGNOSIS — R7881 Bacteremia: Secondary | ICD-10-CM

## 2012-02-14 DIAGNOSIS — N179 Acute kidney failure, unspecified: Secondary | ICD-10-CM

## 2012-02-14 DIAGNOSIS — R509 Fever, unspecified: Secondary | ICD-10-CM

## 2012-02-14 LAB — URINE CULTURE
Colony Count: NO GROWTH
Culture  Setup Time: 201305112030
Culture: NO GROWTH

## 2012-02-14 LAB — BASIC METABOLIC PANEL
BUN: 19 mg/dL (ref 6–23)
Calcium: 8 mg/dL — ABNORMAL LOW (ref 8.4–10.5)
Creatinine, Ser: 1.38 mg/dL — ABNORMAL HIGH (ref 0.50–1.35)
GFR calc non Af Amer: 44 mL/min — ABNORMAL LOW (ref >90)
Glucose, Bld: 112 mg/dL — ABNORMAL HIGH (ref 70–99)
Sodium: 140 mEq/L (ref 135–145)

## 2012-02-14 LAB — LEGIONELLA ANTIGEN, URINE

## 2012-02-14 LAB — CBC
HCT: 36.9 % — ABNORMAL LOW (ref 39.0–52.0)
Hemoglobin: 12.3 g/dL — ABNORMAL LOW (ref 13.0–17.0)
MCH: 31.3 pg (ref 26.0–34.0)
MCHC: 33.3 g/dL (ref 30.0–36.0)
MCV: 93.9 fL (ref 78.0–100.0)

## 2012-02-14 LAB — HIV ANTIBODY (ROUTINE TESTING W REFLEX): HIV: NONREACTIVE

## 2012-02-14 MED ORDER — PIPERACILLIN-TAZOBACTAM 3.375 G IVPB
3.3750 g | Freq: Three times a day (TID) | INTRAVENOUS | Status: DC
  Administered 2012-02-14 – 2012-02-18 (×11): 3.375 g via INTRAVENOUS
  Filled 2012-02-14 (×12): qty 50

## 2012-02-14 MED ORDER — CHLORHEXIDINE GLUCONATE 0.12 % MT SOLN
15.0000 mL | Freq: Two times a day (BID) | OROMUCOSAL | Status: DC
  Administered 2012-02-14 – 2012-02-18 (×6): 15 mL via OROMUCOSAL
  Filled 2012-02-14 (×11): qty 15

## 2012-02-14 MED ORDER — BIOTENE DRY MOUTH MT LIQD
15.0000 mL | Freq: Two times a day (BID) | OROMUCOSAL | Status: DC
  Administered 2012-02-14 – 2012-02-18 (×9): 15 mL via OROMUCOSAL

## 2012-02-14 NOTE — Progress Notes (Addendum)
Subjective: Feeling better. Denies CP, SOB or chills. No abdominal pain, nausea or vomiting. Patient AAOX3.  Objective: Vital signs in last 24 hours: Temp:  [98.1 F (36.7 C)-98.9 F (37.2 C)] 98.1 F (36.7 C) (05/12 2141) Pulse Rate:  [85-97] 97  (05/12 2141) Resp:  [18-20] 18  (05/12 2141) BP: (104-137)/(48-77) 137/77 mmHg (05/12 2141) SpO2:  [98 %-100 %] 98 % (05/12 2141) Weight:  [74.662 kg (164 lb 9.6 oz)] 74.662 kg (164 lb 9.6 oz) (05/12 1100) Weight change:  Last BM Date: 02/13/12  Intake/Output from previous day: 05/11 0701 - 05/12 0700 In: 787.5 [I.V.:787.5] Out: 625 [Urine:625] Total I/O In: 1 [Other:1] Out: -    Physical Exam: General: Alert, awake, oriented x3, in no acute distress. HEENT: No bruits, no goiter. Heart: Regular rate and rhythm, without murmurs, rubs, gallops. Lungs: diffuse rhonchi, no wheezing Abdomen: Soft, nontender, nondistended, positive bowel sounds. Extremities: No clubbing, cyanosis or edema with positive pedal pulses. Neuro: Grossly intact, nonfocal.   Lab Results: Basic Metabolic Panel:  Basename 02/14/12 0425 02/13/12 1157  NA 140 140  K 3.7 4.2  CL 109 103  CO2 23 25  GLUCOSE 112* 134*  BUN 19 18  CREATININE 1.38* 1.43*  CALCIUM 8.0* 9.4  MG -- --  PHOS -- --   CBC:  Basename 02/14/12 0425 02/13/12 1157  WBC 11.2* 10.0  NEUTROABS -- --  HGB 12.3* 14.0  HCT 36.9* 40.9  MCV 93.9 93.6  PLT 208 232   Cardiac Enzymes:  Basename 02/13/12 1157  CKTOTAL --  CKMB --  CKMBINDEX --  TROPONINI <0.30   CBG:  Basename 02/13/12 1148  GLUCAP 140*    Urinalysis:  Basename 02/13/12 1211  COLORURINE YELLOW  LABSPEC 1.017  PHURINE 6.5  GLUCOSEU NEGATIVE  HGBUR NEGATIVE  BILIRUBINUR NEGATIVE  KETONESUR NEGATIVE  PROTEINUR NEGATIVE  UROBILINOGEN 0.2  NITRITE NEGATIVE  LEUKOCYTESUR NEGATIVE   Misc. Labs:  Recent Results (from the past 240 hour(s))  CULTURE, BLOOD (ROUTINE X 2)     Status: Normal (Preliminary  result)   Collection Time   02/13/12 11:57 AM      Component Value Range Status Comment   Specimen Description BLOOD RIGHT ARM  5 ML IN [facility] BOTTLE   Final    Special Requests NONE   Final    Culture  Setup Time [id number]   Final    Culture     Final    Value: GRAM NEGATIVE RODS     Note: Gram Stain Report Called to,Read Back By and Verified With: RN L. EPPERSON ON 02/14/12 AT 1920 BY DTERRY   Report Status PENDING   Incomplete   CULTURE, BLOOD (ROUTINE X 2)     Status: Normal (Preliminary result)   Collection Time   02/13/12 12:10 PM      Component Value Range Status Comment   Specimen Description BLOOD LEFT ARM  5 ML IN [facility] BOTTLE   Final    Special Requests NONE   Final    Culture  Setup Time [id number]   Final    Culture     Final    Value: GRAM NEGATIVE RODS     Note: Gram Stain Report Called to,Read Back By and Verified With: RN L. EPPERSON ON 02/14/12 AT 1920 BY DTERRY   Report Status PENDING   Incomplete   URINE CULTURE     Status: Normal   Collection Time   02/13/12 12:12 PM      Component Value  Range Status Comment   Specimen Description URINE, CATHETERIZED   Final    Special Requests NONE   Final    Culture  Setup Time [id number]   Final    Colony Count NO GROWTH   Final    Culture NO GROWTH   Final    Report Status 02/14/2012 FINAL   Final     Studies/Results: Dg Chest 2 View  02/13/2012  *RADIOLOGY REPORT*  Clinical Data: Fever, chest pain, confusion  CHEST - 2 VIEW  Comparison: 12/17/2011  Findings: Cardiomediastinal silhouette is stable.  There is slight worsening streaky left basilar atelectasis or infiltrate especially on lateral view.  Early infiltrate cannot be excluded.  No pulmonary edema.  IMPRESSION: No pulmonary edema.  Slight worsening streaky left basilar atelectasis or infiltrate.  Original Report Authenticated By: Natasha Mead, M.D.   Ct Head Wo Contrast  02/13/2012  *RADIOLOGY REPORT*  Clinical Data: Weakness, dizziness, confusion  CT HEAD WITHOUT  CONTRAST  Technique:  Contiguous axial images were obtained from the base of the skull through the vertex without contrast.  Comparison: 09/04/2010  Findings: No skull fracture is noted.  Paranasal sinuses and mastoid air cells are unremarkable.  No intracranial hemorrhage, mass effect or midline shift.  No acute cortical infarction.  No mass lesion is noted on this unenhanced scan.  Stable cerebral atrophy.  Stable periventricular and subcortical white matter decreased attenuation consistent with chronic small vessel ischemic changes.  IMPRESSION: No acute intracranial abnormality.  Stable atrophy and chronic white matter disease.  No definite acute cortical infarct.  Original Report Authenticated By: Natasha Mead, M.D.   Dg Chest Port 1 View  02/14/2012  *RADIOLOGY REPORT*  Clinical Data: Evaluate pneumonia  PORTABLE CHEST - 1 VIEW  Comparison: Chest radiograph 02/13/2012  Findings: Normal cardiac silhouette.  Left lower lobe opacity is increasing density compared to prior.  No pneumothorax.  No pulmonary edema.  IMPRESSION: Left lower pneumonia increased in density slightly  Original Report Authenticated By: Genevive Bi, M.D.    Medications: Scheduled Meds:   . antiseptic oral rinse  15 mL Mouth Rinse q12n4p  . aspirin  81 mg Oral Daily  . azithromycin  500 mg Intravenous Q24H  . chlorhexidine  15 mL Mouth Rinse BID  . enoxaparin  40 mg Subcutaneous Q24H  . guaiFENesin  600 mg Oral BID  . metoprolol tartrate  25 mg Oral BID  . mulitivitamin with minerals  1 tablet Oral Daily  . piperacillin-tazobactam (ZOSYN)  IV  3.375 g Intravenous Q8H  . Tamsulosin HCl  0.4 mg Oral QHS  . DISCONTD: cefTRIAXone (ROCEPHIN)  IV  1 g Intravenous Q24H   Continuous Infusions:   . sodium chloride 75 mL/hr at 02/14/12 2019   PRN Meds:.acetaminophen, acetaminophen, albuterol, morphine injection, ondansetron (ZOFRAN) IV, ondansetron  Assessment/Plan: 1-presumed gram negative rods CAP (community acquired  pneumonia): improved SOB, no further fevers or chills. Feeling better. Will continue IV antibiotics; follow pending cultures and continue supportive care.  2-Toxic metabolic encephalopathy: due to fever and PNA infection, causing patient transient  AMS. Will continue providing treatment as specified above.  3-HYPERTENSION, UNSPECIFIED:continue home regimen. BP stable.   4-Fever and chills: due to #1; afebrile since yesterday. Continue IV antibiotics and PRN antipyretics.   5-ARF (acute renal failure): improving; most likely due to pre-renal azotemia. Will continue IVF's and follow Cr trend. BMET in am.   6-BPH (benign prostatic hyperplasia): continue flomax.   7-Gram negative rods Bacteremia: with hx of Pseudomonas bacteremia in  the past. Will start zosyn dose per pharmacy and follow cx speciation and sensitivity  8-DVT: lovenox.     LOS: 1 day   Geovanie Winnett [facility] [phone]  02/14/2012, 10:22 PM

## 2012-02-14 NOTE — Progress Notes (Signed)
CRITICAL VALUE ALERT  Critical value received:  Blood Cultures both sets Positive with gram negative rods both sets  Date of notification:  02/1212  Time of notification:  1923  Critical value read back:yes  Nurse who received alert:  Pernell Dupre, RN- notified patients RN Melina Schools, RN  MD notified (1st page): M. Lynch  Time of first page:  [phone]  MD notified (2nd page):  Time of second page:  Responding MD: M. Lynch  Time MD responded:  [phone]

## 2012-02-14 NOTE — Progress Notes (Signed)
ANTIBIOTIC CONSULT NOTE - INITIAL  Pharmacy Consult for Zosyn Indication: PNA, GNR in 2/2 blood cultures  No Known Allergies  Patient Measurements: Height: 5\' 5"  (165.1 cm) Weight: 164 lb 9.6 oz (74.662 kg) IBW/kg (Calculated) : 61.5   Vital Signs: Temp: 98.9 F (37.2 C) (05/12 1357) Temp src: Oral (05/12 1357) BP: 104/55 mmHg (05/12 1357) Pulse Rate: 87  (05/12 1357) Intake/Output from previous day: 05/11 0701 - 05/12 0700 In: 787.5 [I.V.:787.5] Out: 625 [Urine:625] Intake/Output from this shift:    Labs:  Basename 02/14/12 0425 02/13/12 1157  WBC 11.2* 10.0  HGB 12.3* 14.0  PLT 208 232  LABCREA -- --  CREATININE 1.38* 1.43*   Estimated Creatinine Clearance: 35.6 ml/min (by C-G formula based on Cr of 1.38).    Microbiology: Recent Results (from the past 720 hour(s))  CULTURE, BLOOD (ROUTINE X 2)     Status: Normal (Preliminary result)   Collection Time   02/13/12 11:57 AM      Component Value Range Status Comment   Specimen Description BLOOD RIGHT ARM  5 ML IN [facility] BOTTLE   Final    Special Requests NONE   Final    Culture  Setup Time [id number]   Final    Culture     Final    Value: GRAM NEGATIVE RODS     Note: Gram Stain Report Called to,Read Back By and Verified With: RN L. EPPERSON ON 02/14/12 AT 1920 BY DTERRY   Report Status PENDING   Incomplete   CULTURE, BLOOD (ROUTINE X 2)     Status: Normal (Preliminary result)   Collection Time   02/13/12 12:10 PM      Component Value Range Status Comment   Specimen Description BLOOD LEFT ARM  5 ML IN [facility] BOTTLE   Final    Special Requests NONE   Final    Culture  Setup Time [id number]   Final    Culture     Final    Value: GRAM NEGATIVE RODS     Note: Gram Stain Report Called to,Read Back By and Verified With: RN L. EPPERSON ON 02/14/12 AT 1920 BY DTERRY   Report Status PENDING   Incomplete   URINE CULTURE     Status: Normal   Collection Time   02/13/12 12:12 PM      Component Value Range Status Comment   Specimen Description URINE, CATHETERIZED   Final    Special Requests NONE   Final    Culture  Setup Time [id number]   Final    Colony Count NO GROWTH   Final    Culture NO GROWTH   Final    Report Status 02/14/2012 FINAL   Final     Medical History: Past Medical History  Diagnosis Date  . Hypertension   . Prostate pain   . Bronchitis   . Abdominal desmoid tumor     Medications:  Anti-infectives     Start     Dose/Rate Route Frequency Ordered Stop   02/14/12 2100  piperacillin-tazobactam (ZOSYN) IVPB 3.375 g       3.375 g 12.5 mL/hr over 240 Minutes Intravenous Every 8 hours 02/14/12 2003     02/14/12 1500   azithromycin (ZITHROMAX) 500 mg in dextrose 5 % 250 mL IVPB  Status:  Discontinued        500 mg 250 mL/hr over 60 Minutes Intravenous Every 24 hours 02/13/12 1811 02/13/12 1933   02/14/12 1400   cefTRIAXone (ROCEPHIN) 1 g in  dextrose 5 % 50 mL IVPB  Status:  Discontinued        1 g 100 mL/hr over 30 Minutes Intravenous Every 24 hours 02/13/12 1811 02/14/12 1953   02/14/12 1400   azithromycin (ZITHROMAX) 500 mg in dextrose 5 % 250 mL IVPB        500 mg 250 mL/hr over 60 Minutes Intravenous Every 24 hours 02/13/12 1933 02/24/12 1359   02/13/12 1315   cefTRIAXone (ROCEPHIN) 1 g in dextrose 5 % 50 mL IVPB        1 g 100 mL/hr over 30 Minutes Intravenous  Once 02/13/12 1313 02/13/12 1448   02/13/12 1315   azithromycin (ZITHROMAX) 500 mg in dextrose 5 % 250 mL IVPB        500 mg 250 mL/hr over 60 Minutes Intravenous  Once 02/13/12 1313 02/13/12 1528         Assessment:  89 YOM on Day #2 azithromycin/rocephin for LLL PNA; Tm 103.6 (02/13/12 @ 1200)  Blood cx x 2 = GNR; Ucx NG F  Hx pseudomonas in one of 2 blood cultures sets on 09/04/2010 (Pan sensitive)  Rocephin d/c'd and Zosyn per pharmacy ordered; Azithromycin continued  Goal of Therapy:  Appropriate renal dose of Zosyn  Plan:   Zosyn 3.375g IV q8h, each dose over 4 hours  Follow cx, vitals,  labs  Adjust dose as necessary  Gwen Her PharmD [phone] 02/14/2012 8:09 PM

## 2012-02-15 DIAGNOSIS — J159 Unspecified bacterial pneumonia: Secondary | ICD-10-CM

## 2012-02-15 DIAGNOSIS — R7881 Bacteremia: Secondary | ICD-10-CM

## 2012-02-15 DIAGNOSIS — R509 Fever, unspecified: Secondary | ICD-10-CM

## 2012-02-15 DIAGNOSIS — N179 Acute kidney failure, unspecified: Secondary | ICD-10-CM

## 2012-02-15 LAB — CBC
MCH: 30.8 pg (ref 26.0–34.0)
MCHC: 33 g/dL (ref 30.0–36.0)
MCV: 93.5 fL (ref 78.0–100.0)
Platelets: 198 10*3/uL (ref 150–400)
RBC: 4.02 MIL/uL — ABNORMAL LOW (ref 4.22–5.81)
RDW: 13.2 % (ref 11.5–15.5)

## 2012-02-15 LAB — BASIC METABOLIC PANEL
CO2: 24 mEq/L (ref 19–32)
Calcium: 8.2 mg/dL — ABNORMAL LOW (ref 8.4–10.5)
Creatinine, Ser: 1.38 mg/dL — ABNORMAL HIGH (ref 0.50–1.35)
GFR calc non Af Amer: 44 mL/min — ABNORMAL LOW (ref >90)
Sodium: 139 mEq/L (ref 135–145)

## 2012-02-15 NOTE — Progress Notes (Signed)
UR complete 

## 2012-02-15 NOTE — Progress Notes (Signed)
Subjective: Feeling better. Denies CP, SOB or chills. No abdominal pain, nausea or vomiting. Patient AAOX3.  Objective: Vital signs in last 24 hours: Temp:  [97.7 F (36.5 C)-98.8 F (37.1 C)] 97.7 F (36.5 C) (05/13 1400) Pulse Rate:  [82-97] 82  (05/13 1400) Resp:  [18-19] 19  (05/13 1400) BP: (113-137)/(56-77) 118/59 mmHg (05/13 1400) SpO2:  [98 %-100 %] 100 % (05/13 1400) Weight change:  Last BM Date: 02/14/12  Intake/Output from previous day: 05/12 0701 - 05/13 0700 In: 2936 [P.O.:960; I.V.:1875; IV Piggyback:100] Out: 1875 [Urine:1875] Total I/O In: 100 [P.O.:100] Out: 275 [Urine:275]   Physical Exam: General: Alert, awake, oriented x3, in no acute distress. HEENT: No bruits, no goiter. Heart: Regular rate and rhythm, without murmurs, rubs, gallops. Lungs: diffuse rhonchi, no wheezing Abdomen: Soft, nontender, nondistended, positive bowel sounds. Extremities: No clubbing, cyanosis or edema with positive pedal pulses. Neuro: Grossly intact, nonfocal.   Lab Results: Basic Metabolic Panel:  Basename 02/15/12 0425 02/14/12 0425  NA 139 140  K 4.2 3.7  CL 107 109  CO2 24 23  GLUCOSE 102* 112*  BUN 18 19  CREATININE 1.38* 1.38*  CALCIUM 8.2* 8.0*  MG -- --  PHOS -- --   CBC:  Basename 02/15/12 0425 02/14/12 0425  WBC 9.2 11.2*  NEUTROABS -- --  HGB 12.4* 12.3*  HCT 37.6* 36.9*  MCV 93.5 93.9  PLT 198 208   Cardiac Enzymes:  Basename 02/13/12 1157  CKTOTAL --  CKMB --  CKMBINDEX --  TROPONINI <0.30   CBG:  Basename 02/13/12 1148  GLUCAP 140*    Urinalysis:  Basename 02/13/12 1211  COLORURINE YELLOW  LABSPEC 1.017  PHURINE 6.5  GLUCOSEU NEGATIVE  HGBUR NEGATIVE  BILIRUBINUR NEGATIVE  KETONESUR NEGATIVE  PROTEINUR NEGATIVE  UROBILINOGEN 0.2  NITRITE NEGATIVE  LEUKOCYTESUR NEGATIVE   Misc. Labs:  Recent Results (from the past 240 hour(s))  CULTURE, BLOOD (ROUTINE X 2)     Status: Normal (Preliminary result)   Collection Time   02/13/12 11:57 AM      Component Value Range Status Comment   Specimen Description BLOOD RIGHT ARM  5 ML IN [facility] BOTTLE   Final    Special Requests NONE   Final    Culture  Setup Time [id number]   Final    Culture     Final    Value: GRAM NEGATIVE RODS     Note: Gram Stain Report Called to,Read Back By and Verified With: RN L. EPPERSON ON 02/14/12 AT 1920 BY DTERRY   Report Status PENDING   Incomplete   CULTURE, BLOOD (ROUTINE X 2)     Status: Normal (Preliminary result)   Collection Time   02/13/12 12:10 PM      Component Value Range Status Comment   Specimen Description BLOOD LEFT ARM  5 ML IN [facility] BOTTLE   Final    Special Requests NONE   Final    Culture  Setup Time [id number]   Final    Culture     Final    Value: GRAM NEGATIVE RODS     Note: Gram Stain Report Called to,Read Back By and Verified With: RN L. EPPERSON ON 02/14/12 AT 1920 BY DTERRY   Report Status PENDING   Incomplete   URINE CULTURE     Status: Normal   Collection Time   02/13/12 12:12 PM      Component Value Range Status Comment   Specimen Description URINE, CATHETERIZED   Final  Special Requests NONE   Final    Culture  Setup Time [id number]   Final    Colony Count NO GROWTH   Final    Culture NO GROWTH   Final    Report Status 02/14/2012 FINAL   Final     Studies/Results: Dg Chest Port 1 View  02/14/2012  *RADIOLOGY REPORT*  Clinical Data: Evaluate pneumonia  PORTABLE CHEST - 1 VIEW  Comparison: Chest radiograph 02/13/2012  Findings: Normal cardiac silhouette.  Left lower lobe opacity is increasing density compared to prior.  No pneumothorax.  No pulmonary edema.  IMPRESSION: Left lower pneumonia increased in density slightly  Original Report Authenticated By: Genevive Bi, M.D.    Medications: Scheduled Meds:    . antiseptic oral rinse  15 mL Mouth Rinse q12n4p  . aspirin  81 mg Oral Daily  . azithromycin  500 mg Intravenous Q24H  . chlorhexidine  15 mL Mouth Rinse BID  . enoxaparin  40 mg  Subcutaneous Q24H  . guaiFENesin  600 mg Oral BID  . metoprolol tartrate  25 mg Oral BID  . mulitivitamin with minerals  1 tablet Oral Daily  . piperacillin-tazobactam (ZOSYN)  IV  3.375 g Intravenous Q8H  . Tamsulosin HCl  0.4 mg Oral QHS  . DISCONTD: cefTRIAXone (ROCEPHIN)  IV  1 g Intravenous Q24H   Continuous Infusions:    . sodium chloride 75 mL/hr at 02/14/12 2019   PRN Meds:.acetaminophen, acetaminophen, albuterol, morphine injection, ondansetron (ZOFRAN) IV, ondansetron  Assessment/Plan: 1-presumed gram negative rods CAP (community acquired pneumonia): improved SOB, no further fevers or chills. Feeling better. Will continue IV antibiotics; follow pending cultures and continue supportive care.  2-Toxic metabolic encephalopathy: due to fever and PNA infection, causing patient transient  AMS. Will continue providing treatment as specified above. Per family members concerns for mild dementia.  3-HYPERTENSION, UNSPECIFIED: continue home regimen. BP stable.   4-Fever and chills: due to #1; continue to be afebrile. Continue IV antibiotics and PRN antipyretics.   5-ARF (acute renal failure): improving; most likely due to pre-renal azotemia. Will continue IVF's and follow Cr trend. BMET in am.   6-BPH (benign prostatic hyperplasia): continue flomax.   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 Will continue zosyn dose per pharmacy and follow cx speciation and sensitivity (still pending)  8-DVT: lovenox.     LOS: 2 days   Brandn Mcgath [facility] [phone]  02/15/2012, 5:34 PM

## 2012-02-15 NOTE — Progress Notes (Signed)
Mary lynch np notified of blood cultures coming back as gm neg rods

## 2012-02-15 NOTE — Clinical Documentation Improvement (Signed)
PNEUMONIA DOCUMENTATION CLARIFICATION QUERY  THIS DOCUMENT IS NOT A PERMANENT PART OF THE MEDICAL RECORD  TO RESPOND TO THE THIS QUERY, FOLLOW THE INSTRUCTIONS BELOW:  1. If needed, update documentation for the patient's encounter via the notes activity.  2. Access this query again and click edit on the [employer].  3. After updating, or not, click F2 to complete all highlighted (required) fields concerning your review. Select "additional documentation in the medical record" OR "no additional documentation provided".  4. Click Sign note button.  5. The deficiency will fall out of your InBasket *Please let us know if you are not able to complete this workflow by phone or e-mail (listed below).  Please update your documentation within the medical record to reflect your response to this query.                                                                                    02/15/12  Dear Dr. Gwenlyn Perking / Associates  In a better effort to capture your patient's severity of illness, reflect appropriate length of stay and utilization of resources, a review of the patient medical record has revealed the following indicators.    Based on your clinical judgment, please clarify and document in a progress note and/or discharge summary the clinical condition associated with the following supporting information:  In responding to this query please exercise your independent judgment.  The fact that a query is asked, does not imply that any particular answer is desired or expected. Pt admitted with CAP,  blood cultures   GRAM NEGATIVE RODS  present.  Noted  02/14/12 started on  Zosyn  Indication: PNA, GNR in 2/2 blood cultures.  If possible ,please claifiy if  CAP can be futher specified as any listing below. Thank you.   Possible Clinical Conditions?   Gram Negative Pneumonia (POA?)  Bacterial pneumonia, specify type if known (POA?) Klebsiella PNA E Coli PNA Pseudomonas PNA  Staph/MRSA  PNA Strep PNA Pneumococcal PNA Viral PNA (POA?)   _______Other Condition____________________ _______Cannot Clinically Determine     Supporting Information: Risk Factors: ARF, HTN, CAP   Signs & Symptoms:per H&P:"Generalized malaise and weakness; fever/chills, cough and AMS" Temp 100.5, pulse 118  Diagnostics: -Lab:WBC on 02/14/11 >11.2   -Radiology:PORTABLE CHEST - 1 VIEW  Clinical Data: Evaluate pneumonia IMPRESSION:Left lower pneumonia increased in density slightly  CXR demonstrated chronic bronchitic changes and LLL infiltrate  Microbiology: -Blood C&S:02/14/12 >Positive with gram negative rods  Treatments: -Current ABX: 02/14/12  Zosyn  Indication: PNA, GNR in 2/2 blood cultures -O2 @ 2L/[place] for sats 98% -Respiratory Treatments: Albuterol neb, sputum culture      You may use possible, probable, or suspect with inpatient documentation. possible, probable, suspected diagnoses MUST be documented at the time of discharge  Reviewed: At this point presumed gram negative rods PNA is what i can add to my notes. Thanks and will continue updating base on results.  Thank You,  Andy Gauss RN  Clinical Documentation Specialist:  Pager [phone] E-mail [email or web address]   Health Information Management [place]

## 2012-02-16 DIAGNOSIS — N179 Acute kidney failure, unspecified: Secondary | ICD-10-CM

## 2012-02-16 DIAGNOSIS — R509 Fever, unspecified: Secondary | ICD-10-CM

## 2012-02-16 DIAGNOSIS — J189 Pneumonia, unspecified organism: Secondary | ICD-10-CM

## 2012-02-16 DIAGNOSIS — R7881 Bacteremia: Secondary | ICD-10-CM

## 2012-02-16 DIAGNOSIS — J159 Unspecified bacterial pneumonia: Secondary | ICD-10-CM

## 2012-02-16 LAB — CBC
MCV: 92.6 fL (ref 78.0–100.0)
Platelets: 202 10*3/uL (ref 150–400)
RBC: 3.8 MIL/uL — ABNORMAL LOW (ref 4.22–5.81)
RDW: 13.1 % (ref 11.5–15.5)
WBC: 7 10*3/uL (ref 4.0–10.5)

## 2012-02-16 LAB — BASIC METABOLIC PANEL
Calcium: 8.7 mg/dL (ref 8.4–10.5)
Chloride: 104 mEq/L (ref 96–112)
Creatinine, Ser: 1.3 mg/dL (ref 0.50–1.35)
GFR calc Af Amer: 55 mL/min — ABNORMAL LOW (ref >90)
Sodium: 136 mEq/L (ref 135–145)

## 2012-02-16 NOTE — Consult Note (Signed)
Infectious Diseases Initial Consultation  Reason for Consultation:  GNR bacteremia   HPI: Jerry Berry is a 78 y.o. male HTN,BPH admitted on 02/13/12 for acute onset of feeling poorly, some altered mental status, fever to 103.6, and malaise. Family reported that he was having symptoms for 1 day prior to admission. He states that the day prior to admission he was running around to service his car at [employer] but did not notice being significantly ill. The following morning when he drove back to [place] to finish up his car service, he noticed acute onset of feeling weak and asked his daughter to take him to [facility] for evaluation.  He was started at CAP tx with ceftriaxone and azithro for possible pna due to possible infiltrate on cxr. He has quickly difervesced but his blood cx 4/4 bottles are positive for GNR. ua and urine cx are negative. His abtx were switched to piptazo in the interim.  He denies any cough, diarrhea, dysuria, no sick contacts. He has occ. Abdominal pain due to an abdominal hernia. He also states that he has BRBPR and occ. Suffers from constipation. Last colonoscopy 15 yrs ago  Abtx: azithro #3, stopped 5/13 Ceftriaxone on 5/11-5/12 piptazo 5/12 to the present     . antiseptic oral rinse  15 mL Mouth Rinse q12n4p  . aspirin  81 mg Oral Daily  . azithromycin  500 mg Intravenous Q24H  . chlorhexidine  15 mL Mouth Rinse BID  . enoxaparin  40 mg Subcutaneous Q24H  . guaiFENesin  600 mg Oral BID  . metoprolol tartrate  25 mg Oral BID  . mulitivitamin with minerals  1 tablet Oral Daily  . piperacillin-tazobactam (ZOSYN)  IV  3.375 g Intravenous Q8H  . Tamsulosin HCl  0.4 mg Oral QHS     Past Medical History  Diagnosis Date  . Hypertension   . Prostate pain   . Bronchitis   . Abdominal desmoid tumor     Allergies: No Known Allergies  History  Substance Use Topics  . Smoking status: Never Smoker   . Smokeless tobacco: Not on file  . Alcohol Use: No    History reviewed.  No pertinent family history.   Review of Systems  Constitutional: positive for fever, chills, diaphoresis, activity change,  Fatigue x 1 day and no unexpected weight change.  HENT: Negative for congestion, sore throat, rhinorrhea, sneezing, trouble swallowing and sinus pressure.  Eyes: Negative for photophobia and visual disturbance.  Respiratory: Negative for cough, chest tightness, shortness of breath, wheezing and stridor.  Cardiovascular: Negative for chest pain, palpitations and leg swelling.  Gastrointestinal: Negative for nausea, vomiting, abdominal pain, diarrhea,. Positive for constipation, blood in stool, adn occ. abdominal distention and anal bleeding.  Genitourinary: Negative for dysuria, hematuria, flank pain and difficulty urinating.  Musculoskeletal: Negative for myalgias, back pain, joint swelling, arthralgias and gait problem.  Skin: Negative for color change, pallor, rash and wound.  Neurological: Negative for dizziness, tremors, weakness and light-headedness.  Hematological: Negative for adenopathy. Does not bruise/bleed easily.  Psychiatric/Behavioral: Negative for behavioral problems, confusion, sleep disturbance, dysphoric mood, decreased concentration and agitation.    OBJECTIVE: Temp:  [97.5 F (36.4 C)-97.9 F (36.6 C)] 97.9 F (36.6 C) (05/14 1354) Pulse Rate:  [81-88] 82  (05/14 1354) Resp:  [16-24] 18  (05/14 1354) BP: (113-128)/(60-80) 113/64 mmHg (05/14 1354) SpO2:  [97 %-100 %] 97 % (05/14 1354) Physical Exam  Constitutional: He is oriented to person, place, and time. He appears well-developed and well-nourished.  No distress.  HENT:  Mouth/Throat: Oropharynx is clear and moist. No oropharyngeal exudate.  Cardiovascular: Normal rate, regular rhythm and normal heart sounds. Exam reveals no gallop and no friction rub.  No murmur heard.  Pulmonary/Chest: Effort normal and breath sounds normal. No respiratory distress. He has no wheezes.  Abdominal: Soft.  Bowel sounds are normal. He exhibits no distension. There is no tenderness.  Lymphadenopathy:  He has no cervical adenopathy.  Neurological: He is alert and oriented to person, place, and time.  Skin: Skin is warm and dry. No rash noted. No erythema.  Psychiatric: He has a normal mood and affect. His behavior is normal.    LABS: Results for orders placed during the hospital encounter of 02/13/12 (from the past 48 hour(s))  CBC     Status: Abnormal   Collection Time   02/15/12  4:25 AM      Component Value Range Comment   WBC 9.2  4.0 - 10.5 (K/uL)    RBC 4.02 (*) 4.22 - 5.81 (MIL/uL)    Hemoglobin 12.4 (*) 13.0 - 17.0 (g/dL)    HCT 08.6 (*) 57.8 - 52.0 (%)    MCV 93.5  78.0 - 100.0 (fL)    MCH 30.8  26.0 - 34.0 (pg)    MCHC 33.0  30.0 - 36.0 (g/dL)    RDW 46.9  62.9 - 52.8 (%)    Platelets 198  150 - 400 (K/uL)   BASIC METABOLIC PANEL     Status: Abnormal   Collection Time   02/15/12  4:25 AM      Component Value Range Comment   Sodium 139  135 - 145 (mEq/L)    Potassium 4.2  3.5 - 5.1 (mEq/L)    Chloride 107  96 - 112 (mEq/L)    CO2 24  19 - 32 (mEq/L)    Glucose, Bld 102 (*) 70 - 99 (mg/dL)    BUN 18  6 - 23 (mg/dL)    Creatinine, Ser 4.13 (*) 0.50 - 1.35 (mg/dL)    Calcium 8.2 (*) 8.4 - 10.5 (mg/dL)    GFR calc non Af Amer 44 (*) >90 (mL/min)    GFR calc Af Amer 51 (*) >90 (mL/min)   CBC     Status: Abnormal   Collection Time   02/16/12  4:37 AM      Component Value Range Comment   WBC 7.0  4.0 - 10.5 (K/uL)    RBC 3.80 (*) 4.22 - 5.81 (MIL/uL)    Hemoglobin 12.0 (*) 13.0 - 17.0 (g/dL)    HCT 24.4 (*) 01.0 - 52.0 (%)    MCV 92.6  78.0 - 100.0 (fL)    MCH 31.6  26.0 - 34.0 (pg)    MCHC 34.1  30.0 - 36.0 (g/dL)    RDW 27.2  53.6 - 64.4 (%)    Platelets 202  150 - 400 (K/uL)   BASIC METABOLIC PANEL     Status: Abnormal   Collection Time   02/16/12  4:37 AM      Component Value Range Comment   Sodium 136  135 - 145 (mEq/L)    Potassium 4.0  3.5 - 5.1 (mEq/L)     Chloride 104  96 - 112 (mEq/L)    CO2 23  19 - 32 (mEq/L)    Glucose, Bld 109 (*) 70 - 99 (mg/dL)    BUN 15  6 - 23 (mg/dL)    Creatinine, Ser 0.34  0.50 - 1.35 (mg/dL)  Calcium 8.7  8.4 - 10.5 (mg/dL)    GFR calc non Af Amer 48 (*) >90 (mL/min)    GFR calc Af Amer 55 (*) >90 (mL/min)     MICRO: 5/13 blood cx 4/4 GNR, ID pending 5/13 urine cx are NGTD Strep ur negative Ur legionella negative  IMAGING: No results found. 5/13 cxr = possible LLL infiltrate; however it appears that it may have been there in march 2013  Assessment/Plan:   GNR bacteremia of unknown etiology, possibly gut translocation.  - continue with piptazo for now. Await identification of pathogen from blood cx, maybe able to find oral agent. - please hold off on any picc - continue to repeat blood cx, urine cx, and sputum cx if T > 100.3   Thank you for consultation. Will provide further recs once more micro information becomes available  Aram Beecham B. Drue Second MD MPH [facility] [phone]

## 2012-02-16 NOTE — Progress Notes (Signed)
Subjective: Feeling better. Denies CP, SOB or chills. No abdominal pain, nausea or vomiting. Patient AAOX3.  Objective: Vital signs in last 24 hours: Temp:  [97.5 F (36.4 C)-97.9 F (36.6 C)] 97.9 F (36.6 C) (05/14 1354) Pulse Rate:  [81-88] 82  (05/14 1354) Resp:  [16-24] 18  (05/14 1354) BP: (113-128)/(60-80) 113/64 mmHg (05/14 1354) SpO2:  [97 %-100 %] 97 % (05/14 1354) Weight change:  Last BM Date: 02/14/12  Intake/Output from previous day: 05/13 0701 - 05/14 0700 In: 800 [P.O.:400; I.V.:300; IV Piggyback:100] Out: 1275 [Urine:1275] Total I/O In: 504.1 [P.O.:160; I.V.:344.1] Out: 701 [Urine:700; Stool:1]   Physical Exam: General: Alert, awake, oriented x3, in no acute distress. HEENT: No bruits, no goiter. Heart: Regular rate and rhythm, without murmurs, rubs, gallops. Lungs: diffuse rhonchi, no wheezing Abdomen: Soft, nontender, nondistended, positive bowel sounds. Extremities: No clubbing, cyanosis or edema with positive pedal pulses. Neuro: Grossly intact, nonfocal.   Lab Results: Basic Metabolic Panel:  Basename 02/16/12 0437 02/15/12 0425  NA 136 139  K 4.0 4.2  CL 104 107  CO2 23 24  GLUCOSE 109* 102*  BUN 15 18  CREATININE 1.30 1.38*  CALCIUM 8.7 8.2*  MG -- --  PHOS -- --   CBC:  Basename 02/16/12 0437 02/15/12 0425  WBC 7.0 9.2  NEUTROABS -- --  HGB 12.0* 12.4*  HCT 35.2* 37.6*  MCV 92.6 93.5  PLT 202 198   Cardiac Enzymes: No results found for this basename: CKTOTAL:3,CKMB:3,CKMBINDEX:3,TROPONINI:3 in the last 72 hours CBG: No results found for this basename: GLUCAP:6 in the last 72 hours  Urinalysis: No results found for this basename: COLORURINE:2,APPERANCEUR:2,LABSPEC:2,PHURINE:2,GLUCOSEU:2,HGBUR:2,BILIRUBINUR:2,KETONESUR:2,PROTEINUR:2,UROBILINOGEN:2,NITRITE:2,LEUKOCYTESUR:2 in the last 72 hours Misc. Labs:  Recent Results (from the past 240 hour(s))  CULTURE, BLOOD (ROUTINE X 2)     Status: Normal (Preliminary result)   Collection Time   02/13/12 11:57 AM      Component Value Range Status Comment   Specimen Description BLOOD RIGHT ARM  5 ML IN [facility] BOTTLE   Final    Special Requests NONE   Final    Culture  Setup Time [id number]   Final    Culture     Final    Value: GRAM NEGATIVE RODS     Note: Gram Stain Report Called to,Read Back By and Verified With: RN L. EPPERSON ON 02/14/12 AT 1920 BY DTERRY   Report Status PENDING   Incomplete   CULTURE, BLOOD (ROUTINE X 2)     Status: Normal (Preliminary result)   Collection Time   02/13/12 12:10 PM      Component Value Range Status Comment   Specimen Description BLOOD LEFT ARM  5 ML IN [facility] BOTTLE   Final    Special Requests NONE   Final    Culture  Setup Time [id number]   Final    Culture     Final    Value: GRAM NEGATIVE RODS     Note: Gram Stain Report Called to,Read Back By and Verified With: RN L. EPPERSON ON 02/14/12 AT 1920 BY DTERRY   Report Status PENDING   Incomplete   URINE CULTURE     Status: Normal   Collection Time   02/13/12 12:12 PM      Component Value Range Status Comment   Specimen Description URINE, CATHETERIZED   Final    Special Requests NONE   Final    Culture  Setup Time [id number]   Final    Colony Count NO GROWTH   Final  Culture NO GROWTH   Final    Report Status 02/14/2012 FINAL   Final     Studies/Results: No results found.  Medications: Scheduled Meds:    . antiseptic oral rinse  15 mL Mouth Rinse q12n4p  . aspirin  81 mg Oral Daily  . azithromycin  500 mg Intravenous Q24H  . chlorhexidine  15 mL Mouth Rinse BID  . enoxaparin  40 mg Subcutaneous Q24H  . guaiFENesin  600 mg Oral BID  . metoprolol tartrate  25 mg Oral BID  . mulitivitamin with minerals  1 tablet Oral Daily  . piperacillin-tazobactam (ZOSYN)  IV  3.375 g Intravenous Q8H  . Tamsulosin HCl  0.4 mg Oral QHS   Continuous Infusions:    . sodium chloride 10 mL/hr (02/16/12 1113)   PRN Meds:.acetaminophen, acetaminophen, albuterol, morphine  injection, ondansetron (ZOFRAN) IV, ondansetron  Assessment/Plan: 1-Presumed gram negative rods CAP (community acquired pneumonia): improved SOB, no further fevers or chills. Feeling better. Will continue IV antibiotics (Zosyn and Zithromax); will follow speciation and sensitivity from blood cultures. ID has been consulted in order to provide the name of therapy and antibiotic choice, will follow their recommendations.   2-Toxic metabolic encephalopathy: due to fever and PNA infection, causing patient transient  AMS. Will continue providing treatment as specified above. Per family members concerns for mild dementia (Which will require further evaluation as an outpatient once acute infection is resolved).  3-HYPERTENSION, UNSPECIFIED: continue home regimen. BP stable.   4-Fever and chills: due to #1; continue to be afebrile. Continue IV antibiotics and PRN antipyretics.   5-ARF (acute renal failure): Resolved. Most likely due to pre-renal azotemia. Will change IVF's to [facility]. Patient eating and drinking properly now. Will follow Cr trend with BMET in am.   6-BPH (benign prostatic hyperplasia): continue flomax.   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 Will continue zosyn dose per pharmacy and follow cx speciation and sensitivity (still pending); ID has been consulted in order to determine length of therapy and base route once the microorganism data is available. Appreciate Dr. Ilsa Iha inputs in the case; will follow recommendations.  8-DVT: lovenox.     LOS: 3 days   Arnisha Laffoon [facility] [phone]  02/16/2012, 6:12 PM

## 2012-02-17 ENCOUNTER — Inpatient Hospital Stay (HOSPITAL_COMMUNITY): Payer: Medicare Other

## 2012-02-17 LAB — CULTURE, BLOOD (ROUTINE X 2): Culture  Setup Time: 201305112056

## 2012-02-17 LAB — BASIC METABOLIC PANEL
CO2: 25 mEq/L (ref 19–32)
Calcium: 8.6 mg/dL (ref 8.4–10.5)
GFR calc non Af Amer: 42 mL/min — ABNORMAL LOW (ref >90)
Potassium: 4 mEq/L (ref 3.5–5.1)
Sodium: 139 mEq/L (ref 135–145)

## 2012-02-17 MED ORDER — IOHEXOL 300 MG/ML  SOLN
100.0000 mL | Freq: Once | INTRAMUSCULAR | Status: AC | PRN
  Administered 2012-02-17: 80 mL via INTRAVENOUS

## 2012-02-17 NOTE — Progress Notes (Signed)
ANTIBIOTIC CONSULT NOTE - Follow Up  Pharmacy Consult for Zosyn Indication: PNA, pseudomonas bacteremia  No Known Allergies  Patient Measurements: Height: 5\' 5"  (165.1 cm) Weight: 164 lb 9.6 oz (74.662 kg) IBW/kg (Calculated) : 61.5   Vital Signs: Temp: 97.6 F (36.4 C) (05/15 0620) Temp src: Oral (05/15 0620) BP: 128/72 mmHg (05/15 0620) Pulse Rate: 79  (05/15 0620) Intake/Output from previous day: 05/14 0701 - 05/15 0700 In: 924.1 [P.O.:260; I.V.:514.1; IV Piggyback:150] Out: 1401 [Urine:1400; Stool:1] Intake/Output from this shift:    Labs:  Basename 02/17/12 0532 02/16/12 0437 02/15/12 0425  WBC -- 7.0 9.2  HGB -- 12.0* 12.4*  PLT -- 202 198  LABCREA -- -- --  CREATININE 1.43* 1.30 1.38*   Estimated Creatinine Clearance: 34.4 ml/min (by C-G formula based on Cr of 1.43).   Assessment:  89 YOM on Day #4 Zosyn and D#5 Azithro for LLL PNA and pseudomonas bacteremia (pending sensitivities)  Of note, patient has a history of pan-sensitive pseudomonas bacteremia (1/2) in 2011.  Afebrile, WBC wnl, and Scr slightly up today for CrCl 34 ml/min.  Urine cultures and strep/legionella antigen negative  ID on board, per ID note, azithromycin to be discontinued on 5/13 but order still active.   Goal of Therapy:  Appropriate renal dose of Zosyn  Plan:   Continue Zosyn 3.375g IV q8h, each dose over 4 hours and f/u cultures for sensitivities.  MD:  Consider discontinuing azithromycin if appropriate.  Geoffry Paradise, PharmD.   Pager:  [phone] 11:16 AM

## 2012-02-17 NOTE — Progress Notes (Signed)
Subjective: Patient has no complaints at this time. He is anticipating going home hopefully tomorrow Objective: Filed Vitals:   02/16/12 2115 02/16/12 2147 02/17/12 0620 02/17/12 1400  BP: 127/68 127/68 128/72 109/68  Pulse: 74 78 79 78  Temp: 98.5 F (36.9 C)  97.6 F (36.4 C) 97.5 F (36.4 C)  TempSrc: Oral  Oral Oral  Resp: 16  16 18   Height:      Weight:      SpO2: 100%  98% 95%   Weight change:   Intake/Output Summary (Last 24 hours) at 02/17/12 2154 Last data filed at 02/17/12 1800  Gross per 24 hour  Intake    800 ml  Output    600 ml  Net    200 ml    General: Alert, awake, oriented x3, in no acute distress.  HEENT: [place]/AT PEERL, EOMI Neck: Trachea midline,  no masses, no thyromegal,y no JVD, no carotid bruit OROPHARYNX:  Moist, No exudate/ erythema/lesions.  Heart: Regular rate and rhythm, without murmurs, rubs, gallops, PMI non-displaced, no heaves or thrills on palpation.  Lungs: Clear to auscultation, no wheezing or rhonchi noted. No increased vocal fremitus resonant to percussion  Abdomen: Soft, nontender, nondistended, positive bowel sounds, no masses no hepatosplenomegaly noted..  Neuro: No focal neurological deficits noted cranial nerves II through XII grossly intact. DTRs 2+ bilaterally upper and lower extremities. Strength functional in bilateral upper and lower extremities.    Lab Results:  Basename 02/17/12 0532 02/16/12 0437  NA 139 136  K 4.0 4.0  CL 107 104  CO2 25 23  GLUCOSE 115* 109*  BUN 13 15  CREATININE 1.43* 1.30  CALCIUM 8.6 8.7  MG -- --  PHOS -- --   No results found for this basename: AST:2,ALT:2,ALKPHOS:2,BILITOT:2,PROT:2,ALBUMIN:2 in the last 72 hours No results found for this basename: LIPASE:2,AMYLASE:2 in the last 72 hours  Basename 02/16/12 0437 02/15/12 0425  WBC 7.0 9.2  NEUTROABS -- --  HGB 12.0* 12.4*  HCT 35.2* 37.6*  MCV 92.6 93.5  PLT 202 198   No results found for this basename:  CKTOTAL:3,CKMB:3,CKMBINDEX:3,TROPONINI:3 in the last 72 hours No components found with this basename: POCBNP:3 No results found for this basename: DDIMER:2 in the last 72 hours No results found for this basename: HGBA1C:2 in the last 72 hours No results found for this basename: CHOL:2,HDL:2,LDLCALC:2,TRIG:2,CHOLHDL:2,LDLDIRECT:2 in the last 72 hours No results found for this basename: TSH,T4TOTAL,FREET3,T3FREE,THYROIDAB in the last 72 hours No results found for this basename: VITAMINB12:2,FOLATE:2,FERRITIN:2,TIBC:2,IRON:2,RETICCTPCT:2 in the last 72 hours  Micro Results: Recent Results (from the past 240 hour(s))  CULTURE, BLOOD (ROUTINE X 2)     Status: Normal   Collection Time   02/13/12 11:57 AM      Component Value Range Status Comment   Specimen Description BLOOD RIGHT ARM  5 ML IN [facility] BOTTLE   Final    Special Requests NONE   Final    Culture  Setup Time [id number]   Final    Culture     Final    Value: PSEUDOMONAS AERUGINOSA     Note: Gram Stain Report Called to,Read Back By and Verified With: RN L. EPPERSON ON 02/14/12 AT 1920 BY DTERRY   Report Status 02/17/2012 FINAL   Final   CULTURE, BLOOD (ROUTINE X 2)     Status: Normal   Collection Time   02/13/12 12:10 PM      Component Value Range Status Comment   Specimen Description BLOOD LEFT ARM  5 ML  IN [facility] BOTTLE   Final    Special Requests NONE   Final    Culture  Setup Time [id number]   Final    Culture     Final    Value: PSEUDOMONAS AERUGINOSA     Note: SUSCEPTIBILITIES PERFORMED ON PREVIOUS CULTURE WITHIN THE LAST 5 DAYS.     Note: Gram Stain Report Called to,Read Back By and Verified With: RN L. EPPERSON ON 02/14/12 AT 1920 BY DTERRY   Report Status 02/17/2012 FINAL   Final   URINE CULTURE     Status: Normal   Collection Time   02/13/12 12:12 PM      Component Value Range Status Comment   Specimen Description URINE, CATHETERIZED   Final    Special Requests NONE   Final    Culture  Setup Time [id number]   Final      Colony Count NO GROWTH   Final    Culture NO GROWTH   Final    Report Status 02/14/2012 FINAL   Final     Studies/Results: Dg Chest 2 View  02/13/2012  *RADIOLOGY REPORT*  Clinical Data: Fever, chest pain, confusion  CHEST - 2 VIEW  Comparison: 12/17/2011  Findings: Cardiomediastinal silhouette is stable.  There is slight worsening streaky left basilar atelectasis or infiltrate especially on lateral view.  Early infiltrate cannot be excluded.  No pulmonary edema.  IMPRESSION: No pulmonary edema.  Slight worsening streaky left basilar atelectasis or infiltrate.  Original Report Authenticated By: Natasha Mead, M.D.   Ct Head Wo Contrast  02/13/2012  *RADIOLOGY REPORT*  Clinical Data: Weakness, dizziness, confusion  CT HEAD WITHOUT CONTRAST  Technique:  Contiguous axial images were obtained from the base of the skull through the vertex without contrast.  Comparison: 09/04/2010  Findings: No skull fracture is noted.  Paranasal sinuses and mastoid air cells are unremarkable.  No intracranial hemorrhage, mass effect or midline shift.  No acute cortical infarction.  No mass lesion is noted on this unenhanced scan.  Stable cerebral atrophy.  Stable periventricular and subcortical white matter decreased attenuation consistent with chronic small vessel ischemic changes.  IMPRESSION: No acute intracranial abnormality.  Stable atrophy and chronic white matter disease.  No definite acute cortical infarct.  Original Report Authenticated By: Natasha Mead, M.D.   Dg Chest Port 1 View  02/14/2012  *RADIOLOGY REPORT*  Clinical Data: Evaluate pneumonia  PORTABLE CHEST - 1 VIEW  Comparison: Chest radiograph 02/13/2012  Findings: Normal cardiac silhouette.  Left lower lobe opacity is increasing density compared to prior.  No pneumothorax.  No pulmonary edema.  IMPRESSION: Left lower pneumonia increased in density slightly  Original Report Authenticated By: Genevive Bi, M.D.    Medications: I have reviewed the patient's  current medications. Scheduled Meds:   . antiseptic oral rinse  15 mL Mouth Rinse q12n4p  . aspirin  81 mg Oral Daily  . azithromycin  500 mg Intravenous Q24H  . chlorhexidine  15 mL Mouth Rinse BID  . enoxaparin  40 mg Subcutaneous Q24H  . guaiFENesin  600 mg Oral BID  . metoprolol tartrate  25 mg Oral BID  . mulitivitamin with minerals  1 tablet Oral Daily  . piperacillin-tazobactam (ZOSYN)  IV  3.375 g Intravenous Q8H  . Tamsulosin HCl  0.4 mg Oral QHS   Continuous Infusions:   . sodium chloride 10 mL/hr (02/16/12 1113)   PRN Meds:.acetaminophen, acetaminophen, albuterol, iohexol, morphine injection, ondansetron (ZOFRAN) IV, ondansetron Assessment/Plan: Patient Active Hospital Problem List: CAP (community  acquired pneumonia) (02/13/2012)   Assessment: This is the patient's second pseudomonal infection in recent time period. I discussed this with infectious diseases and we will proceed with a CT of the abdomen and pelvis to evaluate for GI source of occult infection.    Plan: If no significant findings on the CT scan we'll plan to discharge patient home with oral antibiotics to complete his course of antibiotics  HYPERTENSION, UNSPECIFIED (02/20/2010)   Assessment: Blood pressure    Fever and chills (02/13/2012)   Assessment: Resolved     ARF (acute renal failure) (02/13/2012)   Assessment: Patient's creatinine baseline      LOS: 4 days

## 2012-02-17 NOTE — Progress Notes (Signed)
INFECTIOUS DISEASE PROGRESS NOTE  ID: Momen Ham is a 78 y.o. male with BPH, HTN, presents with malaise, leukocytosis found to have PsA bacteremia  Subjective: Afebrile; feeling better since admission.   Abtx: piptazo   Medications:     . antiseptic oral rinse  15 mL Mouth Rinse q12n4p  . aspirin  81 mg Oral Daily  . azithromycin  500 mg Intravenous Q24H  . chlorhexidine  15 mL Mouth Rinse BID  . enoxaparin  40 mg Subcutaneous Q24H  . guaiFENesin  600 mg Oral BID  . metoprolol tartrate  25 mg Oral BID  . mulitivitamin with minerals  1 tablet Oral Daily  . piperacillin-tazobactam (ZOSYN)  IV  3.375 g Intravenous Q8H  . Tamsulosin HCl  0.4 mg Oral QHS    Objective: Vital signs in last 24 hours: Temp:  [97.5 F (36.4 C)-98.5 F (36.9 C)] 97.5 F (36.4 C) (05/15 1400) Pulse Rate:  [74-79] 78  (05/15 1400) Resp:  [16-18] 18  (05/15 1400) BP: (109-128)/(68-72) 109/68 mmHg (05/15 1400) SpO2:  [95 %-100 %] 95 % (05/15 1400) Constitutional: He is oriented to person, place, and time. He appears well-developed and well-nourished. No distress.  HENT:  Mouth/Throat: Oropharynx is clear and moist. No oropharyngeal exudate.  Cardiovascular: Normal rate, regular rhythm and normal heart sounds. Exam reveals no gallop and no friction rub.  No murmur heard.  Pulmonary/Chest: Effort normal and breath sounds normal. No respiratory distress. He has no wheezes.  Abdominal: Soft. Bowel sounds are normal. He exhibits no distension. There is no tenderness.  Lymphadenopathy:  He has no cervical adenopathy.  Neurological: He is alert and oriented to person, place, and time.  Skin: Skin is warm and dry. No rash noted. No erythema.  Psychiatric: He has a normal mood and affect. His behavior is normal.   Lab Results  Basename 02/17/12 0532 02/16/12 0437 02/15/12 0425  WBC -- 7.0 9.2  HGB -- 12.0* 12.4*  HCT -- 35.2* 37.6*  NA 139 136 --  K 4.0 4.0 --  CL 107 104 --  CO2 25 23 --  BUN  13 15 --  CREATININE 1.43* 1.30 --  GLU -- -- --    Microbiology: Recent Results (from the past 240 hour(s))  CULTURE, BLOOD (ROUTINE X 2)     Status: Normal   Collection Time   02/13/12 11:57 AM      Component Value Range Status Comment   Specimen Description BLOOD RIGHT ARM  5 ML IN [facility] BOTTLE   Final    Special Requests NONE   Final    Culture  Setup Time [id number]   Final    Culture     Final    Value: PSEUDOMONAS AERUGINOSA     Note: Gram Stain Report Called to,Read Back By and Verified With: RN L. EPPERSON ON 02/14/12 AT 1920 BY DTERRY   Report Status 02/17/2012 FINAL   Final   CULTURE, BLOOD (ROUTINE X 2)     Status: Normal   Collection Time   02/13/12 12:10 PM      Component Value Range Status Comment   Specimen Description BLOOD LEFT ARM  5 ML IN [facility] BOTTLE   Final    Special Requests NONE   Final    Culture  Setup Time [id number]   Final    Culture     Final    Value: PSEUDOMONAS AERUGINOSA     Note: SUSCEPTIBILITIES PERFORMED ON PREVIOUS CULTURE WITHIN THE LAST 5 DAYS.  Note: Gram Stain Report Called to,Read Back By and Verified With: RN L. EPPERSON ON 02/14/12 AT 1920 BY DTERRY   Report Status 02/17/2012 FINAL   Final   URINE CULTURE     Status: Normal   Collection Time   02/13/12 12:12 PM      Component Value Range Status Comment   Specimen Description URINE, CATHETERIZED   Final    Special Requests NONE   Final    Culture  Setup Time [id number]   Final    Colony Count NO GROWTH   Final    Culture NO GROWTH   Final    Report Status 02/14/2012 FINAL   Final     Studies/Results: No results found.   Assessment/Plan: Pseudomonal bacteremia = no apparent source. Not GU. Presumably due to GI/pancreaticobiliary source. Patient's daughter reports that it occurs every 6 months or so. Recommend to do abd/pelvis CT, would also see if any prostatic abscess (although UA is clean). Length of treatment would be 14 days. Still recommend to hold off on PICC since it  maybe sensitive to an oral agent. Will repeat blood culture to document clearance.  Timera Windt Infectious Diseases 02/17/2012, 4:49 PM

## 2012-02-18 DIAGNOSIS — R7881 Bacteremia: Secondary | ICD-10-CM

## 2012-02-18 DIAGNOSIS — J159 Unspecified bacterial pneumonia: Secondary | ICD-10-CM

## 2012-02-18 DIAGNOSIS — I10 Essential (primary) hypertension: Secondary | ICD-10-CM

## 2012-02-18 LAB — CULTURE, BLOOD (ROUTINE X 2): Culture  Setup Time: 201305112056

## 2012-02-18 MED ORDER — CIPROFLOXACIN HCL 500 MG PO TABS: 500.0000 mg | ORAL_TABLET | Freq: Two times a day (BID) | ORAL | Status: AC

## 2012-02-18 MED ORDER — CIPROFLOXACIN HCL 500 MG PO TABS
500.0000 mg | ORAL_TABLET | Freq: Two times a day (BID) | ORAL | Status: DC
  Administered 2012-02-18: 500 mg via ORAL
  Filled 2012-02-18 (×3): qty 1

## 2012-02-18 NOTE — Discharge Summary (Signed)
Jerry Berry MRN: [id number] DOB/AGE: [birth year] 78 y.o.  Admit date: 02/13/2012 Discharge date: 02/18/2012  Primary Care Physician:  Laurena Slimmer, MD, MD   Discharge Diagnoses:   Patient Active Problem List  Diagnoses  . HYPERTENSION, UNSPECIFIED  . CHEST PAIN  . CAP (community acquired pneumonia)  . Fever and chills  . ARF (acute renal failure)  . BPH (benign prostatic hyperplasia)    DISCHARGE MEDICATION: Medication List  As of 02/18/2012 11:00 AM   TAKE these medications         aspirin 81 MG chewable tablet   Chew 81 mg by mouth daily.      ciprofloxacin 500 MG tablet   Commonly known as: CIPRO   Take 1 tablet (500 mg total) by mouth 2 (two) times daily.      metoprolol tartrate 25 MG tablet   Commonly known as: LOPRESSOR   Take 25 mg by mouth 2 (two) times daily.      mulitivitamin with minerals Tabs   Take 1 tablet by mouth daily.      Tamsulosin HCl 0.4 MG Caps   Commonly known as: FLOMAX   Take 0.4 mg by mouth at bedtime.              Consults:     SIGNIFICANT DIAGNOSTIC STUDIES:  Dg Chest 2 View  02/13/2012  *RADIOLOGY REPORT*  Clinical Data: Fever, chest pain, confusion  CHEST - 2 VIEW  Comparison: 12/17/2011  Findings: Cardiomediastinal silhouette is stable.  There is slight worsening streaky left basilar atelectasis or infiltrate especially on lateral view.  Early infiltrate cannot be excluded.  No pulmonary edema.  IMPRESSION: No pulmonary edema.  Slight worsening streaky left basilar atelectasis or infiltrate.  Original Report Authenticated By: Natasha Mead, M.D.   Ct Head Wo Contrast  02/13/2012  *RADIOLOGY REPORT*  Clinical Data: Weakness, dizziness, confusion  CT HEAD WITHOUT CONTRAST  Technique:  Contiguous axial images were obtained from the base of the skull through the vertex without contrast.  Comparison: 09/04/2010  Findings: No skull fracture is noted.  Paranasal sinuses and mastoid air cells are unremarkable.  No intracranial hemorrhage,  mass effect or midline shift.  No acute cortical infarction.  No mass lesion is noted on this unenhanced scan.  Stable cerebral atrophy.  Stable periventricular and subcortical white matter decreased attenuation consistent with chronic small vessel ischemic changes.  IMPRESSION: No acute intracranial abnormality.  Stable atrophy and chronic white matter disease.  No definite acute cortical infarct.  Original Report Authenticated By: Natasha Mead, M.D.   Ct Abdomen Pelvis W Contrast  02/18/2012  *RADIOLOGY REPORT*  Clinical Data: Pseudomonal bacteremia.  Hypertension.  CT ABDOMEN AND PELVIS WITH CONTRAST  Technique:  Multidetector CT imaging of the abdomen and pelvis was performed following the standard protocol during bolus administration of intravenous contrast.  Contrast: 80mL OMNIPAQUE IOHEXOL 300 MG/ML  SOLN  Comparison: Multiple exams, including 04/29/2009 and 04/14/2009  Findings: Scattered hepatic cysts appear similar to prior MRI. This includes the lesion in the dome of the right hepatic lobe which has a slightly more complex appearance.  The stability over 3 years is reassuring against malignancy.  A small lesion in the upper medial spleen is likewise stable, possibly a small hemangioma.  The pancreas and adrenal glands appear normal.  Bilobed Bosniak category II right renal cyst noted with faint calcification along the septation.  Stable bilateral lower pole cysts are again noted.  No renal calculus or significant new renal abnormality is observed.  Aortoiliac atherosclerotic calcification noted.  The appendix appears normal.  Orally administered contrast extends through to the colon.  Diverticulosis of the descending and sigmoid colon is somewhat prominent, with mild mesenteric stranding extending to a portion of the sigmoid colon on images 71-79 of series 602 suspicious for mild diverticulitis.  No extraluminal gas or abscess is observed.  There is gas density in the urinary bladder.  There is also very  faint low density along the urinary bladder wall suggesting early emphysematous cystitis.  This is best appreciated on the coronal and sagittal images.  No fistula to the urinary bladder is observed.  Disc bulge and spurring noted at the L3-4 level.  IMPRESSION:  1.  Small amount of gas in the urinary bladder, with very faint gas tracking within the bladder wall suspicious for mild emphysematous cystitis.  Recent instrumentation can also cause a similar appearance.  Pseudomonas would be an unusual etiologic agent although other bacterial infections are more commonly associated with emphysematous cystitis, and aggressive antibiotic therapy may be warranted. 2.  Mild sigmoid diverticulitis.  No definite fistula formation between the sigmoid colon and urinary bladder is observed.  3.  Essentially stable benign appearing hepatic, splenic, and renal lesions. 4.  Atherosclerosis.  Original Report Authenticated By: Dellia Cloud, M.D.   Dg Chest Port 1 View  02/14/2012  *RADIOLOGY REPORT*  Clinical Data: Evaluate pneumonia  PORTABLE CHEST - 1 VIEW  Comparison: Chest radiograph 02/13/2012  Findings: Normal cardiac silhouette.  Left lower lobe opacity is increasing density compared to prior.  No pneumothorax.  No pulmonary edema.  IMPRESSION: Left lower pneumonia increased in density slightly  Original Report Authenticated By: Genevive Bi, M.D.     Recent Results (from the past 240 hour(s))  CULTURE, BLOOD (ROUTINE X 2)     Status: Normal   Collection Time   02/13/12 11:57 AM      Component Value Range Status Comment   Specimen Description BLOOD RIGHT ARM  5 ML IN [facility] BOTTLE   Final    Special Requests NONE   Final    Culture  Setup Time [id number]   Final    Culture     Final    Value: PSEUDOMONAS AERUGINOSA     Note: Gram Stain Report Called to,Read Back By and Verified With: RN L. EPPERSON ON 02/14/12 AT 1920 BY DTERRY   Report Status 02/18/2012 FINAL   Final    Organism ID, Bacteria  PSEUDOMONAS AERUGINOSA   Final   CULTURE, BLOOD (ROUTINE X 2)     Status: Normal   Collection Time   02/13/12 12:10 PM      Component Value Range Status Comment   Specimen Description BLOOD LEFT ARM  5 ML IN [facility] BOTTLE   Final    Special Requests NONE   Final    Culture  Setup Time [id number]   Final    Culture     Final    Value: PSEUDOMONAS AERUGINOSA     Note: SUSCEPTIBILITIES PERFORMED ON PREVIOUS CULTURE WITHIN THE LAST 5 DAYS.     Note: Gram Stain Report Called to,Read Back By and Verified With: RN L. EPPERSON ON 02/14/12 AT 1920 BY DTERRY   Report Status 02/17/2012 FINAL   Final   URINE CULTURE     Status: Normal   Collection Time   02/13/12 12:12 PM      Component Value Range Status Comment   Specimen Description URINE, CATHETERIZED   Final    Special  Requests NONE   Final    Culture  Setup Time [id number]   Final    Colony Count NO GROWTH   Final    Culture NO GROWTH   Final    Report Status 02/14/2012 FINAL   Final     BRIEF ADMITTING H & P: 78 y/o male with PMH of BPH, hiatal hernia and HTN; came from home due to AMS, generalized malaise and some fever/chills. Patient and family reported symptoms has been present for the last 1-2 days and worsening. They also endorses some cough. In the ED CXR demonstrated chronic bronchitic changes and LLL infiltrate; patient also found with fever 103, mild confusion and ARF.  CT scan of the head was negative and mentation improved after tylenol control his temp; TRH called to admit patient for further evaluation and treatment.  Patient denies nausea, vomiting, HA's, vision changes; hematemesis, melena or hematochezia.   Hospital Course:  Present on Admission:  .Pseudomonas Bacteremia: Pt was found to have Pseudomonas bacteremia which according to patient's daughter has occurred several times at approximately six month intervals. He has no apparent source and thus a CT scan of the abdomen was done which did not show any source. However  it did show evidence of possible diverticulitis. Pt is on ciprofloxacin for 10 more days to complete a total of 14 days. Pt was evaluated by PT and has no home health needs.  .Community Acquired Pneumonia: Per CXR pt had findings suggestive of pneumonia vs atelectasis. However given patient's severity of illness he was treated for pneumonia. Pt has no requirements for oxygen and is at baseline of functioning.   Marland KitchenHYPERTENSION, UNSPECIFIED: BP well controlled  .ARF (acute renal failure): resolved with IV hydration.   Disposition and Follow-up:  Follow up with Dr. Margaretmary Bayley in 1 week.    DISCHARGE EXAM:  General: Alert, awake, oriented x3, in no acute distress.  Vital Signs: Blood pressure 128/68, pulse 82, temperature 97.6 F (36.4 C), temperature source Oral, resp. rate 18, height 5\' 5"  (1.651 m), weight 74.662 kg (164 lb 9.6 oz), SpO2 96.00%. HEENT: [place]/AT PEERL, EOMI  Neck: Trachea midline, no masses, no thyromegal,y no JVD, no carotid bruit  OROPHARYNX: Moist, No exudate/ erythema/lesions.  Heart: Regular rate and rhythm, without murmurs, rubs, gallops, PMI non-displaced, no heaves or thrills on palpation.  Lungs: Clear to auscultation, no wheezing or rhonchi noted. No increased vocal fremitus resonant to percussion  Abdomen: Soft, nontender, nondistended, positive bowel sounds, no masses no hepatosplenomegaly noted..  Neuro: No focal neurological deficits noted cranial nerves II through XII grossly intact. DTRs 2+ bilaterally upper and lower extremities. Strength functional in bilateral upper and lower extremities.    Basename 02/17/12 0532 02/16/12 0437  NA 139 136  K 4.0 4.0  CL 107 104  CO2 25 23  GLUCOSE 115* 109*  BUN 13 15  CREATININE 1.43* 1.30  CALCIUM 8.6 8.7  MG -- --  PHOS -- --   No results found for this basename: AST:2,ALT:2,ALKPHOS:2,BILITOT:2,PROT:2,ALBUMIN:2 in the last 72 hours No results found for this basename: LIPASE:2,AMYLASE:2 in the last 72  hours  Basename 02/16/12 0437  WBC 7.0  NEUTROABS --  HGB 12.0*  HCT 35.2*  MCV 92.6  PLT 202   Total time for discharge process including face to face time approximately 40 minutes. Signed: Kreg Earhart A. 02/18/2012, 11:00 AM

## 2012-02-18 NOTE — Evaluation (Addendum)
Physical Therapy Evaluation Patient Details Name: Jerry Berry MRN: [id number] DOB: 10-04-1922 Today's Date: 02/18/2012 Time: 4098-1191 PT Time Calculation (min): 26 min  PT Assessment / Plan / Recommendation Clinical Impression  78 y.o. male admitted to [facility] for weakness, AMS and PNA.  He presents today pretty close to his normal baseline level of functioning.  He reports he normally feels a litte stronger, but otherwise is at the same level of independence.  I encouraged him to walk three times a day at home to get his endurance and strength back, but he doesn't need any acute or f/u PT at this time.      PT Assessment  Patent does not need any further PT services    Follow Up Recommendations  No PT follow up    Barriers to Discharge  none      lEquipment Recommendations  None recommended by PT    Recommendations for Other Services   none  Frequency   NA-one time evaluation   Precautions / Restrictions   none  Pertinent Vitals/Pain No reports of pain, no vitals taken, pt asymptomatic.      Mobility  Bed Mobility Bed Mobility: Supine to Sit;Sitting - Scoot to Edge of Bed Supine to Sit: 6: Modified independent (Device/Increase time);With rails;HOB flat Sitting - Scoot to Edge of Bed: 7: Independent  Transfers Transfers: Sit to Stand;Stand to Sit Sit to Stand: 6: Modified independent (Device/Increase time);With upper extremity assist Stand to Sit: 6: Modified independent (Device/Increase time);With upper extremity assist Details for Transfer Assistance: relies on hands to get to standing.   Ambulation/Gait Ambulation/Gait Assistance: 7: Independent Ambulation Distance (Feet): 450 Feet Assistive device: None Ambulation/Gait Assistance Details: no LOB, no staggering.   Gait Pattern: Step-through pattern   Stairs Yes  Stairs Assistance 6: Modified independent (Device/Increase time)  Stair Management Technique One rail Right;Forwards  Number of Stairs 5  (limited to 5  due to IV is still attached.)      Visit Information  Last PT Received On: 02/18/12 Assistance Needed: +1    Subjective Data  Subjective: Pt reports that family comes in to help his wife who is disabled.  He was interested to find out if he can get services for her and I told him he had to contact her primary care physician.  We could only set up services for him if needed.   Patient Stated Goal: to go home today.     Prior Functioning  Home Living Lives With: Spouse Available Help at Discharge: Family (daughters come by about every day to check on the wife.  ) Type of Home: House Home Access: Stairs to enter [employer] of Steps: 5 Entrance Stairs-Rails: Left Home Layout: Multi-level;Able to live on main level with bedroom/bathroom (3 levels per pt) Alternate Level Stairs-Number of Steps: 14 Alternate Level Stairs-Rails: Right;Left;Can reach both Bathroom Shower/Tub: Tub only;Walk-in shower;Curtain (jacuzzie tub) Bathroom Toilet: Handicapped height Home Adaptive Equipment: Grab bars in shower;Grab bars around toilet;Straight cane;Wheelchair - manual Prior Function Level of Independence: Independent with assistive device(s) Able to Take Stairs?: Yes Driving: Yes Vocation: Retired Musician: No difficulties Dominant Hand: Right    Cognition  Overall Cognitive Status: Appears within functional limits for tasks assessed/performed Arousal/Alertness: Awake/alert Orientation Level: Appears intact for tasks assessed Behavior During Session: [facility] for tasks performed    Extremity/Trunk Assessment Right Lower Extremity Assessment RLE ROM/Strength/Tone: [facility] for tasks assessed Left Lower Extremity Assessment LLE ROM/Strength/Tone: [facility] for tasks assessed   End of Session PT -  End of Session Activity Tolerance: Patient tolerated treatment well Patient left: in chair;with call bell/phone within reach   Warwick B. Athalee Esterline, PT, DPT [phone] 02/18/2012,  2:19 PM

## 2012-06-29 ENCOUNTER — Emergency Department (HOSPITAL_COMMUNITY)
Admission: EM | Admit: 2012-06-29 | Discharge: 2012-06-29 | Disposition: A | Discharge status: Home / Self Care | Payer: Medicare Other | Attending: Emergency Medicine | Admitting: Emergency Medicine

## 2012-06-29 ENCOUNTER — Emergency Department (HOSPITAL_COMMUNITY): Payer: Medicare Other

## 2012-06-29 ENCOUNTER — Encounter (HOSPITAL_COMMUNITY): Payer: Self-pay | Admitting: *Deleted

## 2012-06-29 DIAGNOSIS — Z79899 Other long term (current) drug therapy: Secondary | ICD-10-CM | POA: Insufficient documentation

## 2012-06-29 DIAGNOSIS — B349 Viral infection, unspecified: Secondary | ICD-10-CM

## 2012-06-29 DIAGNOSIS — B9789 Other viral agents as the cause of diseases classified elsewhere: Secondary | ICD-10-CM | POA: Insufficient documentation

## 2012-06-29 DIAGNOSIS — N509 Disorder of male genital organs, unspecified: Secondary | ICD-10-CM | POA: Insufficient documentation

## 2012-06-29 DIAGNOSIS — I10 Essential (primary) hypertension: Secondary | ICD-10-CM | POA: Insufficient documentation

## 2012-06-29 LAB — COMPREHENSIVE METABOLIC PANEL
ALT: 16 U/L (ref 0–53)
AST: 21 U/L (ref 0–37)
CO2: 24 mEq/L (ref 19–32)
Calcium: 9.2 mg/dL (ref 8.4–10.5)
Creatinine, Ser: 1.46 mg/dL — ABNORMAL HIGH (ref 0.50–1.35)
GFR calc Af Amer: 48 mL/min — ABNORMAL LOW (ref >90)
GFR calc non Af Amer: 41 mL/min — ABNORMAL LOW (ref >90)
Sodium: 137 mEq/L (ref 135–145)
Total Protein: 6.6 g/dL (ref 6.0–8.3)

## 2012-06-29 LAB — URINALYSIS, ROUTINE W REFLEX MICROSCOPIC
Bilirubin Urine: NEGATIVE
Hgb urine dipstick: NEGATIVE
Nitrite: NEGATIVE
Specific Gravity, Urine: 1.019 (ref 1.005–1.030)
Urobilinogen, UA: 0.2 mg/dL (ref 0.0–1.0)
pH: 5.5 (ref 5.0–8.0)

## 2012-06-29 LAB — CBC WITH DIFFERENTIAL/PLATELET
Basophils Absolute: 0 10*3/uL (ref 0.0–0.1)
Eosinophils Absolute: 0 10*3/uL (ref 0.0–0.7)
Eosinophils Relative: 0 % (ref 0–5)
HCT: 40.5 % (ref 39.0–52.0)
MCH: 29.9 pg (ref 26.0–34.0)
MCHC: 34.6 g/dL (ref 30.0–36.0)
MCV: 86.4 fL (ref 78.0–100.0)
Monocytes Absolute: 0.7 10*3/uL (ref 0.1–1.0)
Platelets: 249 10*3/uL (ref 150–400)
RDW: 14.4 % (ref 11.5–15.5)

## 2012-06-29 MED ORDER — SODIUM CHLORIDE 0.9 % IV BOLUS (SEPSIS)
1000.0000 mL | Freq: Once | INTRAVENOUS | Status: AC
  Administered 2012-06-29: 1000 mL via INTRAVENOUS

## 2012-06-29 NOTE — ED Notes (Signed)
Pt states "Nothing is wrong with me, I was made to come here by my family and paramedics". Pt denies pain, states "the only pain I'm having is abdominal pain because I haven't eaten anything today". Pt states he went to [employer] and while at [employer] felt chills and when he got back his family didn't like the way he looked so he was made to come to the hospital. Pt denies pain, weakness or dizziness. Pt does have a fever, states he's unsure of when it started but the niece states the fever started around 10 am and got 2 tylenol at 1145 today. Pt states his doctor was called and was told to come here.

## 2012-06-29 NOTE — ED Notes (Signed)
Pt lives w/ family they went to [employer] when they returned he was diaphoretic, weak and feverish, took temp at home 101.4 oral, gave 2 extra strength tylenol temp 99.6, been eating/drinking normal, no nausea/vomiting, family called PCP, Dr. Chestine Spore and was told to send to ER. BP 154/84 and 128/74, HR 98, placed on oxygen d/t sats at 92%.

## 2012-06-29 NOTE — ED Provider Notes (Signed)
History     CSN: [id number]  Arrival date & time 06/29/12  1336   First MD Initiated Contact with Patient 06/29/12 [phone]      Chief Complaint  Patient presents with  . Weakness    (Consider location/radiation/quality/duration/timing/severity/associated sxs/prior treatment) HPI... fever, chills this afternoon while at [facility] hardware [employer]. Patient went home and went to bed. Still felt cold. Brief episode of urinary incontinence. No coughing or stiff neck. He took nothing at home. He said he is feeling slightly better now  Past Medical History  Diagnosis Date  . Hypertension   . Prostate pain   . Bronchitis   . Abdominal desmoid tumor     History reviewed. No pertinent past surgical history.  History reviewed. No pertinent family history.  History  Substance Use Topics  . Smoking status: Never Smoker   . Smokeless tobacco: Never Used  . Alcohol Use: No      Review of Systems  All other systems reviewed and are negative.    Allergies  Review of patient's allergies indicates no known allergies.  Home Medications   Current Outpatient Rx  Name Route Sig Dispense Refill  . ASPIRIN 81 MG PO CHEW Oral Chew 81 mg by mouth daily.    Marland Kitchen LATANOPROST 0.005 % OP SOLN Both Eyes Place 1 drop into both eyes at bedtime.    Marland Kitchen METOPROLOL TARTRATE 25 MG PO TABS Oral Take 25 mg by mouth 2 (two) times daily.    . ADULT MULTIVITAMIN W/MINERALS CH Oral Take 1 tablet by mouth daily.    Marland Kitchen TAMSULOSIN HCL 0.4 MG PO CAPS Oral Take 0.4 mg by mouth at bedtime.      BP 138/70  Pulse 95  Temp 102 F (38.9 C) (Rectal)  Resp 20  SpO2 97%  Physical Exam  Nursing note and vitals reviewed. Constitutional: He is oriented to person, place, and time. He appears well-developed and well-nourished.  HENT:  Head: Normocephalic and atraumatic.  Eyes: Conjunctivae normal and EOM are normal. Pupils are equal, round, and reactive to light.  Neck: Normal range of motion. Neck supple.    Cardiovascular: Normal rate, regular rhythm and normal heart sounds.   Pulmonary/Chest: Effort normal and breath sounds normal.  Abdominal: Soft. Bowel sounds are normal.  Musculoskeletal: Normal range of motion.  Neurological: He is alert and oriented to person, place, and time.  Skin: Skin is warm and dry.  Psychiatric: He has a normal mood and affect.    ED Course  Procedures (including critical care time)  Labs Reviewed  CBC WITH DIFFERENTIAL - Abnormal; Notable for the following:    WBC 12.8 (*)     Neutrophils Relative 85 (*)     Neutro Abs 10.8 (*)     Lymphocytes Relative 10 (*)     All other components within normal limits  COMPREHENSIVE METABOLIC PANEL - Abnormal; Notable for the following:    Glucose, Bld 111 (*)     Creatinine, Ser 1.46 (*)     GFR calc non Af Amer 41 (*)     GFR calc Af Amer 48 (*)     All other components within normal limits  URINALYSIS, ROUTINE W REFLEX MICROSCOPIC   Dg Chest 2 View  06/29/2012  *RADIOLOGY REPORT*  Clinical Data: Weakness, question pneumonia, history smoking, hypertension  CHEST - 2 VIEW  Comparison: 02/14/2012  Findings: Enlargement of cardiac silhouette. Calcified tortuous aorta. Mediastinal contours and pulmonary vascularity otherwise normal. Minimal bibasilar atelectasis. Lungs otherwise clear.  No pleural effusion or pneumothorax. No acute osseous findings.  IMPRESSION: Minimal bibasilar atelectasis. Mild enlargement of cardiac silhouette.   Original Report Authenticated By: Lollie Marrow, M.D.      1. Viral syndrome       MDM  Patient looks well. He is not dehydrated. Chest x-ray shows no pneumonia. Urinalysis normal. Suspect viral syndrome         Donnetta Hutching, MD 06/29/12 [phone]

## 2012-06-29 NOTE — ED Notes (Signed)
[id number]<AV> Expected date:<BR> Expected time: 1:22 PM<BR> Means of arrival:Ambulance<BR> Comments:<BR> Elderly, general fatigue, fever

## 2013-01-13 ENCOUNTER — Encounter (HOSPITAL_COMMUNITY): Payer: Self-pay | Admitting: Emergency Medicine

## 2013-01-13 ENCOUNTER — Emergency Department (INDEPENDENT_AMBULATORY_CARE_PROVIDER_SITE_OTHER)
Admission: EM | Admit: 2013-01-13 | Discharge: 2013-01-13 | Disposition: A | Discharge status: Home / Self Care | Payer: Medicare Other | Source: Home / Self Care | Attending: Emergency Medicine | Admitting: Emergency Medicine

## 2013-01-13 DIAGNOSIS — I10 Essential (primary) hypertension: Secondary | ICD-10-CM

## 2013-01-13 DIAGNOSIS — R5381 Other malaise: Secondary | ICD-10-CM

## 2013-01-13 DIAGNOSIS — R5383 Other fatigue: Secondary | ICD-10-CM

## 2013-01-13 LAB — POCT I-STAT, CHEM 8
Chloride: 106 mEq/L (ref 96–112)
HCT: 34 % — ABNORMAL LOW (ref 39.0–52.0)
Hemoglobin: 11.6 g/dL — ABNORMAL LOW (ref 13.0–17.0)
Potassium: 4.1 mEq/L (ref 3.5–5.1)
Sodium: 142 mEq/L (ref 135–145)

## 2013-01-13 NOTE — ED Notes (Signed)
Pt c/o feeling fatigue. Concerns about blood pressure. Pt state he went to see pcp but office was closed.  Pt has no other concerns or symptoms.

## 2013-01-13 NOTE — ED Provider Notes (Signed)
Chief Complaint:   Chief Complaint  Patient presents with  . Fatigue    24 hrs. "dont feel right"    History of Present Illness:   Jerry Berry is a delightful 79 year old gentleman, a patient of Dr. Margaretmary Bayley, who presents today with a 2 to three-day history of mild dizziness and mild generalized weakness. He denies whirling vertigo or presyncope. He states he's not off balance. He denies any headache, diplopia, blurry vision, or any other neurological symptoms such as paresthesias, numbness, tingling, localized muscle weakness, difficulty with speech, swallowing, coordination, balance, or ambulation. He feels mildly weak all over, but denies any specific focal weakness. He wanted to be checked for blood pressure elevation today. He is taking metoprolol for blood pressure. He is on no new medications. He denies any fever, chills, URI symptoms, coughing, wheezing, shortness of breath, chest pain, tightness, pressure, palpitations, presyncope, syncope, or abdominal pain. He's had no nausea, vomiting, or anorexia. His bowel movements have been regular and he denies any blood in the stool. He has BPH but denies any current urinary symptoms including dysuria, frequency, urgency, or blood in the urine. He's had no ankle edema or extremity pain.   Review of Systems:  Other than noted above, the patient denies any of the following symptoms. Systemic:  No fever, chills, sweats, fatigue, myalgias, headache, or anorexia. Eye:  No redness, pain or drainage. ENT:  No earache, nasal congestion, rhinorrhea, sinus pressure, or sore throat. Lungs:  No cough, sputum production, wheezing, shortness of breath.  Cardiovascular:  No chest pain, palpitations, or syncope. GI:  No nausea, vomiting, abdominal pain or diarrhea. GU:  No dysuria, frequency, or hematuria. Skin:  No rash or pruritis.  PMFSH:  Past medical history, family history, social history, meds, and allergies were reviewed.   He takes aspirin,  eyedrops, Lopressor, multivitamin, and Flomax. Diagnoses include hypertension, BPH, and he has no medication allergies.   Physical Exam:   Vital signs:  BP 130/66  Pulse 108  Temp(Src) 98.2 F (36.8 C) (Oral)  Resp 18  SpO2 100%  Filed Vitals:   01/13/13 1625 Sitting  01/13/13 1706 Supine  01/13/13 1707 Standing   BP: 113/61 130/61 130/66  Pulse: 95 95 108  Temp: 98.2 F (36.8 C)    TempSrc: Oral    Resp: 18    SpO2: 100%      General:  Alert, in no distress. Eye:  PERRL, full EOMs.  Lids and conjunctivas were normal. ENT:  TMs and canals were normal, without erythema or inflammation.  Nasal mucosa was clear and uncongested, without drainage.  Mucous membranes were moist.  Pharynx was clear, without exudate or drainage.  There were no oral ulcerations or lesions. Neck:  Supple, no adenopathy, tenderness or mass. Thyroid was normal. Lungs:  No respiratory distress.  Lungs were clear to auscultation, without wheezes, rales or rhonchi.  Breath sounds were clear and equal bilaterally. Heart:  Regular rhythm, without gallops, murmers or rubs. Abdomen:  Soft, flat, and non-tender to palpation.  No hepatosplenomagaly or mass. Neurological exam: Neurological examination: The patient is alert and oriented x3. Speech is clear, fluent, and appropriate. Cranial nerves are intact. There is no pronator drift and finger to nose was normal. Muscle strength, sensation, and DTRs are normal. Babinskis are downgoing. Station and gait were normal. Romberg sign is negative, patient is able to perform tandem gait well. Skin:  Clear, warm, and dry, without rash or lesions.  Labs:   Results for orders placed  during the hospital encounter of 01/13/13  POCT I-STAT, CHEM 8      Result Value Range   Sodium 142  135 - 145 mEq/L   Potassium 4.1  3.5 - 5.1 mEq/L   Chloride 106  96 - 112 mEq/L   BUN 19  6 - 23 mg/dL   Creatinine, Ser 1.61 (*) 0.50 - 1.35 mg/dL   Glucose, Bld 096 (*) 70 - 99 mg/dL    Calcium, Ion 0.45  4.09 - 1.30 mmol/L   TCO2 24  0 - 100 mmol/L   Hemoglobin 11.6 (*) 13.0 - 17.0 g/dL   HCT 81.1 (*) 91.4 - 78.2 %    Assessment:  The primary encounter diagnosis was Fatigue. A diagnosis of HYPERTENSION, UNSPECIFIED was also pertinent to this visit.  The cause of his fatigue is uncertain. He does have mild anemia, slight increase in creatinine, but these have been noted previously and did not appear to be acute. I have suggested he continue with his current meds, avoid salt and sodium, and followup with Dr. Chestine Spore next week.   Plan:   1.  The following meds were prescribed:   Discharge Medication List as of 01/13/2013  5:35 PM     2.  The patient was instructed in symptomatic care and handouts were given. 3.  The patient was told to return if becoming worse in any way, if no better in 3 or 4 days, and given some red flag symptoms such as fever, headache, shortness of breath, chest pain, vomiting, or abdominal pain that would indicate earlier return.    Reuben Likes, MD 01/13/13 [phone]

## 2014-05-27 ENCOUNTER — Inpatient Hospital Stay (HOSPITAL_COMMUNITY)
Admission: EM | Admit: 2014-05-27 | Discharge: 2014-05-31 | DRG: 378 | Disposition: A | Discharge status: Home / Self Care | Payer: PRIVATE HEALTH INSURANCE | Attending: Internal Medicine | Admitting: Internal Medicine

## 2014-05-27 ENCOUNTER — Emergency Department (HOSPITAL_COMMUNITY): Payer: PRIVATE HEALTH INSURANCE

## 2014-05-27 ENCOUNTER — Encounter (HOSPITAL_COMMUNITY): Payer: Self-pay | Admitting: Emergency Medicine

## 2014-05-27 DIAGNOSIS — D62 Acute posthemorrhagic anemia: Secondary | ICD-10-CM | POA: Diagnosis not present

## 2014-05-27 DIAGNOSIS — Z7982 Long term (current) use of aspirin: Secondary | ICD-10-CM | POA: Diagnosis not present

## 2014-05-27 DIAGNOSIS — N400 Enlarged prostate without lower urinary tract symptoms: Secondary | ICD-10-CM | POA: Diagnosis present

## 2014-05-27 DIAGNOSIS — N183 Chronic kidney disease, stage 3 unspecified: Secondary | ICD-10-CM | POA: Diagnosis present

## 2014-05-27 DIAGNOSIS — K5731 Diverticulosis of large intestine without perforation or abscess with bleeding: Secondary | ICD-10-CM | POA: Diagnosis present

## 2014-05-27 DIAGNOSIS — I951 Orthostatic hypotension: Secondary | ICD-10-CM | POA: Diagnosis present

## 2014-05-27 DIAGNOSIS — R55 Syncope and collapse: Secondary | ICD-10-CM | POA: Diagnosis not present

## 2014-05-27 DIAGNOSIS — R5381 Other malaise: Secondary | ICD-10-CM | POA: Diagnosis present

## 2014-05-27 DIAGNOSIS — I129 Hypertensive chronic kidney disease with stage 1 through stage 4 chronic kidney disease, or unspecified chronic kidney disease: Secondary | ICD-10-CM | POA: Diagnosis present

## 2014-05-27 DIAGNOSIS — K625 Hemorrhage of anus and rectum: Secondary | ICD-10-CM | POA: Diagnosis present

## 2014-05-27 DIAGNOSIS — I10 Essential (primary) hypertension: Secondary | ICD-10-CM | POA: Diagnosis present

## 2014-05-27 DIAGNOSIS — R5383 Other fatigue: Secondary | ICD-10-CM | POA: Diagnosis present

## 2014-05-27 LAB — CBC WITH DIFFERENTIAL/PLATELET
BASOS ABS: 0.1 10*3/uL (ref 0.0–0.1)
Basophils Relative: 1 % (ref 0–1)
EOS ABS: 0.1 10*3/uL (ref 0.0–0.7)
EOS PCT: 1 % (ref 0–5)
HCT: 43.4 % (ref 39.0–52.0)
Hemoglobin: 14.4 g/dL (ref 13.0–17.0)
Lymphocytes Relative: 34 % (ref 12–46)
Lymphs Abs: 2.6 10*3/uL (ref 0.7–4.0)
MCH: 30.3 pg (ref 26.0–34.0)
MCHC: 33.2 g/dL (ref 30.0–36.0)
MCV: 91.2 fL (ref 78.0–100.0)
Monocytes Absolute: 0.7 10*3/uL (ref 0.1–1.0)
Monocytes Relative: 9 % (ref 3–12)
Neutro Abs: 4.2 10*3/uL (ref 1.7–7.7)
Neutrophils Relative %: 55 % (ref 43–77)
PLATELETS: 234 10*3/uL (ref 150–400)
RBC: 4.76 MIL/uL (ref 4.22–5.81)
RDW: 13.2 % (ref 11.5–15.5)
WBC: 7.6 10*3/uL (ref 4.0–10.5)

## 2014-05-27 LAB — POC OCCULT BLOOD, ED: Fecal Occult Bld: POSITIVE — AB

## 2014-05-27 LAB — BASIC METABOLIC PANEL
ANION GAP: 13 (ref 5–15)
BUN: 17 mg/dL (ref 6–23)
CALCIUM: 9.3 mg/dL (ref 8.4–10.5)
CO2: 24 mEq/L (ref 19–32)
CREATININE: 1.42 mg/dL — AB (ref 0.50–1.35)
Chloride: 102 mEq/L (ref 96–112)
GFR calc Af Amer: 49 mL/min — ABNORMAL LOW (ref >90)
GFR, EST NON AFRICAN AMERICAN: 42 mL/min — AB (ref >90)
Glucose, Bld: 114 mg/dL — ABNORMAL HIGH (ref 70–99)
Potassium: 4.2 mEq/L (ref 3.7–5.3)
SODIUM: 139 meq/L (ref 137–147)

## 2014-05-27 LAB — TROPONIN I

## 2014-05-27 MED ORDER — IOHEXOL 300 MG/ML  SOLN: 25.0000 mL | INTRAMUSCULAR | Status: DC

## 2014-05-27 MED ORDER — IOHEXOL 300 MG/ML  SOLN
25.0000 mL | Freq: Once | INTRAMUSCULAR | Status: AC | PRN
  Administered 2014-05-27: 25 mL via ORAL

## 2014-05-27 MED ORDER — METOPROLOL TARTRATE 25 MG PO TABS
25.0000 mg | ORAL_TABLET | Freq: Every day | ORAL | Status: DC
  Administered 2014-05-27: 25 mg via ORAL
  Filled 2014-05-27 (×3): qty 1

## 2014-05-27 MED ORDER — SODIUM CHLORIDE 0.9 % IJ SOLN
3.0000 mL | Freq: Two times a day (BID) | INTRAMUSCULAR | Status: DC
  Administered 2014-05-27 – 2014-05-31 (×5): 3 mL via INTRAVENOUS

## 2014-05-27 MED ORDER — TAMSULOSIN HCL 0.4 MG PO CAPS
0.4000 mg | ORAL_CAPSULE | Freq: Every day | ORAL | Status: DC
  Administered 2014-05-27 – 2014-05-30 (×4): 0.4 mg via ORAL
  Filled 2014-05-27 (×6): qty 1

## 2014-05-27 MED ORDER — LATANOPROST 0.005 % OP SOLN
1.0000 [drp] | Freq: Every day | OPHTHALMIC | Status: DC
  Administered 2014-05-27 – 2014-05-30 (×4): 1 [drp] via OPHTHALMIC
  Filled 2014-05-27: qty 2.5

## 2014-05-27 MED ORDER — MECLIZINE HCL 25 MG PO TABS
25.0000 mg | ORAL_TABLET | Freq: Every evening | ORAL | Status: DC | PRN
  Filled 2014-05-27: qty 1

## 2014-05-27 MED ORDER — IOHEXOL 300 MG/ML  SOLN
100.0000 mL | Freq: Once | INTRAMUSCULAR | Status: AC | PRN
  Administered 2014-05-27: 100 mL via INTRAVENOUS

## 2014-05-27 MED ORDER — ADULT MULTIVITAMIN W/MINERALS CH
1.0000 | ORAL_TABLET | Freq: Every day | ORAL | Status: DC
  Administered 2014-05-27 – 2014-05-30 (×4): 1 via ORAL
  Filled 2014-05-27 (×7): qty 1

## 2014-05-27 MED ORDER — SODIUM CHLORIDE 0.9 % IV SOLN
INTRAVENOUS | Status: DC
  Administered 2014-05-27 – 2014-05-29 (×5): via INTRAVENOUS

## 2014-05-27 NOTE — ED Notes (Signed)
Pt noticed dark red blood in his stool twice today- pt reports that for the past hour he has felt very weak and became concerned.  Denies pain anywhere, denies N/V, neuro exam negative.

## 2014-05-27 NOTE — H&P (Signed)
[facility] History and Physical  Rielly Corlett GYI:[id number] DOB: 1922/08/21 DOA: 05/27/2014  Referring physician: EDP PCP: Foye Spurling, MD   Chief Complaint: BRBPR   HPI: Jerry Berry is a 80 y.o. male who presents to the ED with 3-5 hour history of rectal bleeding.  Has had 2 bloody BM at home.  Presented to ED, had 3rd bloody BM in ED.  Onset was sudden, quality is BRBPR, no associated CP nor SOB, does have some generalized weakness.  Review of Systems: Systems reviewed.  As above, otherwise negative  Past Medical History  Diagnosis Date  . Hypertension   . Prostate pain   . Bronchitis   . Abdominal desmoid tumor    History reviewed. No pertinent past surgical history. Social History:  reports that he has never smoked. He has never used smokeless tobacco. He reports that he does not drink alcohol or use illicit drugs.  No Known Allergies  No family history on file.   Prior to Admission medications   Medication Sig Start Date End Date Taking? Authorizing Provider  aspirin 81 MG chewable tablet Chew 81 mg by mouth at bedtime.    Yes Historical Provider, MD  latanoprost (XALATAN) 0.005 % ophthalmic solution Place 1 drop into both eyes at bedtime.   Yes Historical Provider, MD  meclizine (ANTIVERT) 25 MG tablet Take 25 mg by mouth at bedtime as needed for dizziness.   Yes Historical Provider, MD  metoprolol tartrate (LOPRESSOR) 25 MG tablet Take 25 mg by mouth at bedtime.    Yes Historical Provider, MD  Multiple Vitamin (MULITIVITAMIN WITH MINERALS) TABS Take 1 tablet by mouth at bedtime.    Yes Historical Provider, MD  Tamsulosin HCl (FLOMAX) 0.4 MG CAPS Take 0.4 mg by mouth at bedtime.   Yes Historical Provider, MD   Physical Exam: Filed Vitals:   05/27/14 1739  BP: 149/78  Pulse: 85  Temp:   Resp: 16    BP 149/78  Pulse 85  Temp(Src) 97.8 F (36.6 C) (Oral)  Resp 16  Wt 74.39 kg (164 lb)  SpO2 98%  General Appearance:    Alert, oriented, no  distress, appears stated age  Head:    Normocephalic, atraumatic  Eyes:    PERRL, EOMI, sclera non-icteric        Nose:   Nares without drainage or epistaxis. Mucosa, turbinates normal  Throat:   Moist mucous membranes. Oropharynx without erythema or exudate.  Neck:   Supple. No carotid bruits.  No thyromegaly.  No lymphadenopathy.   Back:     No CVA tenderness, no spinal tenderness  Lungs:     Clear to auscultation bilaterally, without wheezes, rhonchi or rales  Chest wall:    No tenderness to palpitation  Heart:    Regular rate and rhythm without murmurs, gallops, rubs  Abdomen:     Soft, non-tender, nondistended, normal bowel sounds, no organomegaly  Genitalia:    deferred  Rectal:    deferred  Extremities:   No clubbing, cyanosis or edema.  Pulses:   2+ and symmetric all extremities  Skin:   Skin color, texture, turgor normal, no rashes or lesions  Lymph nodes:   Cervical, supraclavicular, and axillary nodes normal  Neurologic:   CNII-XII intact. Normal strength, sensation and reflexes      throughout    Labs on Admission:  Basic Metabolic Panel:  Recent Labs Lab 05/27/14 1722  NA 139  K 4.2  CL 102  CO2 24  GLUCOSE 114*  BUN 17  CREATININE 1.42*  CALCIUM 9.3   Liver Function Tests: No results found for this basename: AST, ALT, ALKPHOS, BILITOT, PROT, ALBUMIN,  in the last 168 hours No results found for this basename: LIPASE, AMYLASE,  in the last 168 hours No results found for this basename: AMMONIA,  in the last 168 hours CBC:  Recent Labs Lab 05/27/14 1722  WBC 7.6  NEUTROABS 4.2  HGB 14.4  HCT 43.4  MCV 91.2  PLT 234   Cardiac Enzymes:  Recent Labs Lab 05/27/14 1722  TROPONINI <0.30    BNP (last 3 results) No results found for this basename: PROBNP,  in the last 8760 hours CBG: No results found for this basename: GLUCAP,  in the last 168 hours  Radiological Exams on Admission: Ct Abdomen Pelvis W Contrast  05/27/2014   CLINICAL DATA:  Dark  red blood in stool today.  Weakness.  EXAM: CT ABDOMEN AND PELVIS WITH CONTRAST  TECHNIQUE: Multidetector CT imaging of the abdomen and pelvis was performed using the standard protocol following bolus administration of intravenous contrast.  CONTRAST:  122mL OMNIPAQUE IOHEXOL 300 MG/ML  SOLN  COMPARISON:  Abdominal pelvic CT 02/17/2012.  FINDINGS: Lung bases: Stable mild atelectasis at both lung bases. No significant pleural or pericardial effusion.  Liver/Biliary/Pancreas: Multiple low density hepatic lesions are re- demonstrated. Most of these are well circumscribed and consistent with incidental cysts. There is a 2.1 cm lesion in the dome of the right hepatic lobe which does not appear to reflect a simple cyst, although is unchanged. No evidence of gallstones, gallbladder wall thickening or biliary dilatation. The pancreas appears normal.  Spleen/Adrenal glands: Unremarkable.  Kidneys/Ureters/Bladder: Bilateral renal cysts are again noted. There are some calcifications within the wall of a 5.4 cm right renal cyst, but these are unchanged. There is no evidence of solid renal mass or hydronephrosis. There is no evidence of urinary tract calculus. The bladder appears normal.  Bowel/Peritoneum: The stomach, small bowel, appendix and colon demonstrate no acute findings. There appears to be a diverticulum of the terminal ileum without surrounding inflammation. There are multiple colonic diverticula. There is some fluid within the rectum. No bowel wall thickening is apparent.  Retroperitoneum/Pelvis: There are no enlarged abdominal or pelvic lymph nodes. Stable aortoiliac atherosclerosis. The prostate gland is moderately enlarged.  Abdominal wall: No abdominal wall masses or hernias.  Musculoskeletal: No acute or significant osseus findings. There are degenerative changes throughout the spine.  IMPRESSION: 1. No acute findings or clear explanation for GI bleeding. Patient does have multiple colonic diverticula, a likely  source of bright red blood per rectum. 2. Stable hepatic and renal cysts. 3. Stable moderate enlargement of the prostate gland.   Electronically Signed   By: Camie Patience M.D.   On: 05/27/2014 19:59    EKG: Independently reviewed.  Assessment/Plan Principal Problem:   BRBPR (bright red blood per rectum) Active Problems:   HYPERTENSION, UNSPECIFIED   CKD (chronic kidney disease) stage 3, GFR 30-59 ml/min   1. BRBPR - has diverticular on CT scan, no other findings on CT, Dr. Deatra Ina will see patient tomorrow.  Putting patient on clear liquids until then, suspect diverticular bleed vs malignancy.  Repeat H/H at midnight and CBC/BMP in AM.  NS at 75 cc/hr ordered 2. HTN - continue home meds 3. CKD stage 3 - chronic and at baseline   Dr. Deatra Ina called by EDP and will see patient in AM.  Code Status: Full  Family Communication: Family at  bedside Disposition Plan: Admit to inpatient   Time spent: 70 min  GARDNER, JARED M. [facility] Pager [phone]  If 7AM-7PM, please contact the day team taking care of the patient [email or web address] Password [facility] 05/27/2014, 8:37 PM

## 2014-05-27 NOTE — ED Provider Notes (Signed)
CSN: [id number]     Arrival date & time 05/27/14  1634 History   First MD Initiated Contact with Patient 05/27/14 1729     Chief Complaint  Patient presents with  . Weakness  . Rectal Bleeding     (Consider location/radiation/quality/duration/timing/severity/associated sxs/prior Treatment) Patient is a 80 y.o. male presenting with weakness and hematochezia. The history is provided by the patient.  Weakness This is a new problem. The current episode started 3 to 5 hours ago. The problem occurs constantly. The problem has not changed since onset.Associated symptoms include abdominal pain. Pertinent negatives include no chest pain and no shortness of breath. Nothing aggravates the symptoms. Nothing relieves the symptoms. He has tried nothing for the symptoms. The treatment provided no relief.  Rectal Bleeding Associated symptoms: abdominal pain   Associated symptoms: no dizziness, no fever and no vomiting     Past Medical History  Diagnosis Date  . Hypertension   . Prostate pain   . Bronchitis   . Abdominal desmoid tumor    History reviewed. No pertinent past surgical history. No family history on file. History  Substance Use Topics  . Smoking status: Never Smoker   . Smokeless tobacco: Never Used  . Alcohol Use: No    Review of Systems  Constitutional: Negative for fever and chills.  Respiratory: Negative for cough and shortness of breath.   Cardiovascular: Negative for chest pain and leg swelling.  Gastrointestinal: Positive for abdominal pain and hematochezia. Negative for vomiting.  Neurological: Positive for weakness. Negative for dizziness.  All other systems reviewed and are negative.     Allergies  Review of patient's allergies indicates no known allergies.  Home Medications   Prior to Admission medications   Medication Sig Start Date End Date Taking? Authorizing Provider  aspirin 81 MG chewable tablet Chew 81 mg by mouth daily.    Historical Provider, MD   latanoprost (XALATAN) 0.005 % ophthalmic solution Place 1 drop into both eyes at bedtime.    Historical Provider, MD  metoprolol tartrate (LOPRESSOR) 25 MG tablet Take 25 mg by mouth 2 (two) times daily.    Historical Provider, MD  Multiple Vitamin (MULITIVITAMIN WITH MINERALS) TABS Take 1 tablet by mouth daily.    Historical Provider, MD  Tamsulosin HCl (FLOMAX) 0.4 MG CAPS Take 0.4 mg by mouth at bedtime.    Historical Provider, MD   BP 149/78  Pulse 85  Temp(Src) 97.8 F (36.6 C) (Oral)  Resp 16  Wt 164 lb (74.39 kg)  SpO2 98% Physical Exam  Nursing note and vitals reviewed. Constitutional: He is oriented to person, place, and time. He appears well-developed and well-nourished. No distress.  HENT:  Head: Normocephalic and atraumatic.  Mouth/Throat: No oropharyngeal exudate.  Eyes: EOM are normal. Pupils are equal, round, and reactive to light.  Neck: Normal range of motion. Neck supple.  Cardiovascular: Normal rate and regular rhythm.  Exam reveals no friction rub.   No murmur heard. Pulmonary/Chest: Effort normal and breath sounds normal. No respiratory distress. He has no wheezes. He has no rales.  Abdominal: Soft. He exhibits no distension. There is tenderness (mild, diffuse). There is no rebound.  Genitourinary: Rectal exam shows no external hemorrhoid, no internal hemorrhoid, no fissure, no mass and no tenderness. Guaiac positive stool.  Musculoskeletal: Normal range of motion. He exhibits no edema.  Neurological: He is alert and oriented to person, place, and time.  Skin: He is not diaphoretic.    ED Course  Procedures (including critical  care time) Labs Review Labs Reviewed  BASIC METABOLIC PANEL - Abnormal; Notable for the following:    Glucose, Bld 114 (*)    Creatinine, Ser 1.42 (*)    GFR calc non Af Amer 42 (*)    GFR calc Af Amer 49 (*)    All other components within normal limits  CBC WITH DIFFERENTIAL  TROPONIN I    Imaging Review Ct Abdomen Pelvis W  Contrast  05/27/2014   CLINICAL DATA:  Dark red blood in stool today.  Weakness.  EXAM: CT ABDOMEN AND PELVIS WITH CONTRAST  TECHNIQUE: Multidetector CT imaging of the abdomen and pelvis was performed using the standard protocol following bolus administration of intravenous contrast.  CONTRAST:  119mL OMNIPAQUE IOHEXOL 300 MG/ML  SOLN  COMPARISON:  Abdominal pelvic CT 02/17/2012.  FINDINGS: Lung bases: Stable mild atelectasis at both lung bases. No significant pleural or pericardial effusion.  Liver/Biliary/Pancreas: Multiple low density hepatic lesions are re- demonstrated. Most of these are well circumscribed and consistent with incidental cysts. There is a 2.1 cm lesion in the dome of the right hepatic lobe which does not appear to reflect a simple cyst, although is unchanged. No evidence of gallstones, gallbladder wall thickening or biliary dilatation. The pancreas appears normal.  Spleen/Adrenal glands: Unremarkable.  Kidneys/Ureters/Bladder: Bilateral renal cysts are again noted. There are some calcifications within the wall of a 5.4 cm right renal cyst, but these are unchanged. There is no evidence of solid renal mass or hydronephrosis. There is no evidence of urinary tract calculus. The bladder appears normal.  Bowel/Peritoneum: The stomach, small bowel, appendix and colon demonstrate no acute findings. There appears to be a diverticulum of the terminal ileum without surrounding inflammation. There are multiple colonic diverticula. There is some fluid within the rectum. No bowel wall thickening is apparent.  Retroperitoneum/Pelvis: There are no enlarged abdominal or pelvic lymph nodes. Stable aortoiliac atherosclerosis. The prostate gland is moderately enlarged.  Abdominal wall: No abdominal wall masses or hernias.  Musculoskeletal: No acute or significant osseus findings. There are degenerative changes throughout the spine.  IMPRESSION: 1. No acute findings or clear explanation for GI bleeding. Patient  does have multiple colonic diverticula, a likely source of bright red blood per rectum. 2. Stable hepatic and renal cysts. 3. Stable moderate enlargement of the prostate gland.   Electronically Signed   By: Camie Patience M.D.   On: 05/27/2014 19:59     EKG Interpretation   Date/Time:  Sunday May 27 2014 16:42:31 EDT Ventricular Rate:  91 PR Interval:  154 QRS Duration: 78 QT Interval:  386 QTC Calculation: 474 R Axis:   45 Text Interpretation:  Normal sinus rhythm Nonspecific ST and T wave  abnormality Abnormal ECG Similar to prior Confirmed by Mingo Amber  MD, [place]  ([id number]) on 05/27/2014 5:36:49 PM      MDM   Final diagnoses:  BRBPR (bright red blood per rectum)  CKD (chronic kidney disease) stage 3, GFR 30-59 ml/min  Unspecified essential hypertension    29M here with dark red blood in his stool today - 3 episodes, felt weak and decided to come in. No dizziness. No CP or SOB. On aspirin, no other blood thinners. Followed by Dr. Deatra Ina for GI, last scope around 20 years ago.  Here hemoccult positive, mild abdominal discomfort on exam. Renal function at baseline, normal Hgb. Will scan.  Scan shows diverticula, no diverticulitis. I spoke with Dr. Deatra Ina with GI who will see patient tomorrow morning. Patient admitted to  medicine.  Evelina Bucy, MD 05/27/14 [phone]

## 2014-05-28 ENCOUNTER — Encounter: Payer: Self-pay | Admitting: Gastroenterology

## 2014-05-28 DIAGNOSIS — I10 Essential (primary) hypertension: Secondary | ICD-10-CM | POA: Diagnosis present

## 2014-05-28 DIAGNOSIS — D62 Acute posthemorrhagic anemia: Secondary | ICD-10-CM | POA: Diagnosis not present

## 2014-05-28 DIAGNOSIS — N183 Chronic kidney disease, stage 3 unspecified: Secondary | ICD-10-CM | POA: Diagnosis present

## 2014-05-28 DIAGNOSIS — R55 Syncope and collapse: Secondary | ICD-10-CM | POA: Diagnosis not present

## 2014-05-28 LAB — COMPREHENSIVE METABOLIC PANEL
ALT: 15 U/L (ref 0–53)
AST: 25 U/L (ref 0–37)
Albumin: 3.4 g/dL — ABNORMAL LOW (ref 3.5–5.2)
Alkaline Phosphatase: 65 U/L (ref 39–117)
Anion gap: 12 (ref 5–15)
BUN: 18 mg/dL (ref 6–23)
CALCIUM: 8.6 mg/dL (ref 8.4–10.5)
CO2: 22 meq/L (ref 19–32)
CREATININE: 1.32 mg/dL (ref 0.50–1.35)
Chloride: 103 mEq/L (ref 96–112)
GFR, EST AFRICAN AMERICAN: 53 mL/min — AB (ref >90)
GFR, EST NON AFRICAN AMERICAN: 46 mL/min — AB (ref >90)
Glucose, Bld: 155 mg/dL — ABNORMAL HIGH (ref 70–99)
Potassium: 4.6 mEq/L (ref 3.7–5.3)
SODIUM: 137 meq/L (ref 137–147)
Total Bilirubin: 0.7 mg/dL (ref 0.3–1.2)
Total Protein: 6.1 g/dL (ref 6.0–8.3)

## 2014-05-28 LAB — CBC
HCT: 37.1 % — ABNORMAL LOW (ref 39.0–52.0)
HCT: 31.8 % — ABNORMAL LOW (ref 39.0–52.0)
HEMOGLOBIN: 12.1 g/dL — AB (ref 13.0–17.0)
Hemoglobin: 11.2 g/dL — ABNORMAL LOW (ref 13.0–17.0)
MCH: 30 pg (ref 26.0–34.0)
MCH: 32.1 pg (ref 26.0–34.0)
MCHC: 32.6 g/dL (ref 30.0–36.0)
MCHC: 35.2 g/dL (ref 30.0–36.0)
MCV: 91.8 fL (ref 78.0–100.0)
MCV: 91.1 fL (ref 78.0–100.0)
Platelets: 215 10*3/uL (ref 150–400)
Platelets: 295 10*3/uL (ref 150–400)
RBC: 4.04 MIL/uL — ABNORMAL LOW (ref 4.22–5.81)
RBC: 3.49 MIL/uL — AB (ref 4.22–5.81)
RDW: 13.2 % (ref 11.5–15.5)
RDW: 13.3 % (ref 11.5–15.5)
WBC: 6.7 10*3/uL (ref 4.0–10.5)
WBC: 5.2 10*3/uL (ref 4.0–10.5)

## 2014-05-28 LAB — HEMOGLOBIN AND HEMATOCRIT, BLOOD
HCT: 38.8 % — ABNORMAL LOW (ref 39.0–52.0)
Hemoglobin: 13.3 g/dL (ref 13.0–17.0)

## 2014-05-28 MED ORDER — PANTOPRAZOLE SODIUM 40 MG IV SOLR
40.0000 mg | Freq: Two times a day (BID) | INTRAVENOUS | Status: DC
  Administered 2014-05-28 (×3): 40 mg via INTRAVENOUS
  Filled 2014-05-28 (×5): qty 40

## 2014-05-28 NOTE — Progress Notes (Signed)
UR completed Marquie Aderhold K. [facility] [place], RN, BSN, [place], CCM  05/28/2014 11:37 AM

## 2014-05-28 NOTE — Progress Notes (Signed)
PROGRESS NOTE    Jerry Berry HEN:[id number] DOB: Oct 31, 1921 DOA: 05/27/2014 PCP: Foye Spurling, MD  HPI/Brief narrative 81 year old male patient with history of hypertension, apparent previous diverticular bleed, last EGD/colonoscopy 15 years ago, presented to the ED with bright red bleeding per rectum. CT abdomen shows multiple colonic diverticula which is the likely cause. Patient had syncopal episode x1 early 8/24 AM. GI consulted and plan to manage conservatively for now.   Assessment/Plan:  1. Acute lower GI bleeding: Likely diverticular. Patient had 3 bloody BMs since admission and the last one at 7:30 AM. Denies abdominal or rectal pain. States he had previous episode of such rectal bleeding which resolved spontaneously. Last colonoscopy/EGD apparently 15 years ago. GI consulted and will possibly manage conservatively unless he does not resolve or worsens. Treat supportively with IV fluids, serial CBC monitoring and transfuse as needed. Hold aspirin. 2. Syncope x1: Likely from hypotension related to GI bleed. Check orthostatics. IV fluids. Monitor. Hold antihypertensives. 3. Acute post hemorrhagic anemia: Unclear baseline hemoglobin. Initial hemoglobin in ED was 14.4 which may be spuriously high from hemoconcentration. Dropped to 12.1. Follow CBCs closely and transfuse if hemoglobin less than 8 g per DL. 4. Hypertension: Soft blood pressures. Hold metoprolol. 5. Stage III chronic kidney disease: Creatinine stable. Follow BMP. 6. Prostatomegaly:   Code Status:  Full Family Communication:  None at bedside. Disposition Plan:  Home when medically stable.   Consultants:   Gastroenterology.  Procedures:   None  Antibiotics:   None   Subjective:  3 bloody BMs overnight in last one was at 7:30 AM. No rectal or abdominal pain. Had a brief episode of syncope. No chest pain, dyspnea, dizziness or lightheadedness.  Objective: Filed Vitals:   05/28/14 0225 05/28/14 0226  05/28/14 0250 05/28/14 0603  BP:  98/55 114/58 101/50  Pulse:  67 64 78  Temp: 98 F (36.7 C)   97.4 F (36.3 C)  TempSrc: Oral   Oral  Resp:  16  17  Height:      Weight:    72.258 kg (159 lb 4.8 oz)  SpO2:  100%  99%    Intake/Output Summary (Last 24 hours) at 05/28/14 1107 Last data filed at 05/28/14 1045  Gross per 24 hour  Intake 1888.75 ml  Output    675 ml  Net 1213.75 ml   Filed Weights   05/27/14 1644 05/27/14 2130 05/28/14 0603  Weight: 74.39 kg (164 lb) 72.213 kg (159 lb 3.2 oz) 72.258 kg (159 lb 4.8 oz)     Exam:  General exam:  Pleasant elderly male lying comfortably in bed. Respiratory system: Clear. No increased work of breathing. Cardiovascular system: S1 & S2 heard, RRR. No JVD, murmurs, gallops, clicks or pedal edema. telemetry: Sinus rhythm. Gastrointestinal system: Abdomen is nondistended, soft and nontender. Normal bowel sounds heard. Central nervous system: Alert and oriented. No focal neurological deficits. Extremities: Symmetric 5 x 5 power.   Data Reviewed: Basic Metabolic Panel:  Recent Labs Lab 05/27/14 1722 05/28/14 0401  NA 139 137  K 4.2 4.6  CL 102 103  CO2 24 22  GLUCOSE 114* 155*  BUN 17 18  CREATININE 1.42* 1.32  CALCIUM 9.3 8.6   Liver Function Tests:  Recent Labs Lab 05/28/14 0401  AST 25  ALT 15  ALKPHOS 65  BILITOT 0.7  PROT 6.1  ALBUMIN 3.4*   No results found for this basename: LIPASE, AMYLASE,  in the last 168 hours No results found for this basename:  AMMONIA,  in the last 168 hours CBC:  Recent Labs Lab 05/27/14 1722 05/28/14 0025 05/28/14 0401  WBC 7.6  --  6.7  NEUTROABS 4.2  --   --   HGB 14.4 13.3 12.1*  HCT 43.4 38.8* 37.1*  MCV 91.2  --  91.8  PLT 234  --  215   Cardiac Enzymes:  Recent Labs Lab 05/27/14 1722  TROPONINI <0.30   BNP (last 3 results) No results found for this basename: PROBNP,  in the last 8760 hours CBG: No results found for this basename: GLUCAP,  in the last 168  hours  No results found for this or any previous visit (from the past 240 hour(s)).     Studies: Ct Abdomen Pelvis W Contrast  05/27/2014   CLINICAL DATA:  Dark red blood in stool today.  Weakness.  EXAM: CT ABDOMEN AND PELVIS WITH CONTRAST  TECHNIQUE: Multidetector CT imaging of the abdomen and pelvis was performed using the standard protocol following bolus administration of intravenous contrast.  CONTRAST:  152mL OMNIPAQUE IOHEXOL 300 MG/ML  SOLN  COMPARISON:  Abdominal pelvic CT 02/17/2012.  FINDINGS: Lung bases: Stable mild atelectasis at both lung bases. No significant pleural or pericardial effusion.  Liver/Biliary/Pancreas: Multiple low density hepatic lesions are re- demonstrated. Most of these are well circumscribed and consistent with incidental cysts. There is a 2.1 cm lesion in the dome of the right hepatic lobe which does not appear to reflect a simple cyst, although is unchanged. No evidence of gallstones, gallbladder wall thickening or biliary dilatation. The pancreas appears normal.  Spleen/Adrenal glands: Unremarkable.  Kidneys/Ureters/Bladder: Bilateral renal cysts are again noted. There are some calcifications within the wall of a 5.4 cm right renal cyst, but these are unchanged. There is no evidence of solid renal mass or hydronephrosis. There is no evidence of urinary tract calculus. The bladder appears normal.  Bowel/Peritoneum: The stomach, small bowel, appendix and colon demonstrate no acute findings. There appears to be a diverticulum of the terminal ileum without surrounding inflammation. There are multiple colonic diverticula. There is some fluid within the rectum. No bowel wall thickening is apparent.  Retroperitoneum/Pelvis: There are no enlarged abdominal or pelvic lymph nodes. Stable aortoiliac atherosclerosis. The prostate gland is moderately enlarged.  Abdominal wall: No abdominal wall masses or hernias.  Musculoskeletal: No acute or significant osseus findings. There are  degenerative changes throughout the spine.  IMPRESSION: 1. No acute findings or clear explanation for GI bleeding. Patient does have multiple colonic diverticula, a likely source of bright red blood per rectum. 2. Stable hepatic and renal cysts. 3. Stable moderate enlargement of the prostate gland.   Electronically Signed   By: Camie Patience M.D.   On: 05/27/2014 19:59        Scheduled Meds: . latanoprost  1 drop Both Eyes QHS  . multivitamin with minerals  1 tablet Oral QHS  . pantoprazole (PROTONIX) IV  40 mg Intravenous Q12H  . sodium chloride  3 mL Intravenous Q12H  . tamsulosin  0.4 mg Oral QHS   Continuous Infusions: . sodium chloride 125 mL/hr at 05/28/14 1046    Principal Problem:   BRBPR (bright red blood per rectum) Active Problems:   HYPERTENSION, UNSPECIFIED   CKD (chronic kidney disease) stage 3, GFR 30-59 ml/min    Time spent:  30 minutes    Dontasia Miranda, MD, FACP, FHM. [facility] Pager [phone]  If 7PM-7AM, please contact night-coverage [email or web address] Password [email or web address] 05/28/2014, 11:07 AM    LOS: 1  day               

## 2014-05-28 NOTE — Consult Note (Signed)
Referring Provider: No ref. provider found Primary Care Physician:  Foye Spurling, MD Primary Gastroenterologist:  Dr. Deatra Ina  Reason for Consultation:  GI bleed  HPI: Jerry Berry is a 80 y.o. male with limited PMH who presented to the ED at [facility] hospital with complaints of rectal bleeding that began the same day as admission. Had 2 bloody BMs at home. Presented to ED, had 3rd bloody BM in ED. Onset was sudden, quality is BRBPR, no associated CP nor SOB.  Says that he has seen red blood in the past at times, but it always just went away and he never saw anyone for it.  Hgb was  14.4 grams on admission.  Hgb is 21.1 grams this AM.  He's had a couple more BM's with blood since he has been on the floor including one or two this AM.  Denies abdominal pain.  No nausea or vomiting.  CT scan of the abdomen and pelvis did not show any acute findings to account for his bleeding, but did show multiple colonic diverticula.  He is not on any anticoagulation at home.    From GI standpoint, last colonoscopy was in 01/1999 with diverticulosis and melanosis coli.  Flex sig in 04/2000 showed internal hemorrhoids and diverticulosis.  EGD 12/1998 he had a duodenal and pre-pyloric ulcer.   Past Medical History  Diagnosis Date  . Hypertension   . Prostate pain   . Bronchitis   . Abdominal desmoid tumor     History reviewed. No pertinent past surgical history.  Prior to Admission medications   Medication Sig Start Date End Date Taking? Authorizing Provider  aspirin 81 MG chewable tablet Chew 81 mg by mouth at bedtime.    Yes Historical Provider, MD  latanoprost (XALATAN) 0.005 % ophthalmic solution Place 1 drop into both eyes at bedtime.   Yes Historical Provider, MD  meclizine (ANTIVERT) 25 MG tablet Take 25 mg by mouth at bedtime as needed for dizziness.   Yes Historical Provider, MD  metoprolol tartrate (LOPRESSOR) 25 MG tablet Take 25 mg by mouth at bedtime.    Yes Historical Provider, MD  Multiple  Vitamin (MULITIVITAMIN WITH MINERALS) TABS Take 1 tablet by mouth at bedtime.    Yes Historical Provider, MD  Tamsulosin HCl (FLOMAX) 0.4 MG CAPS Take 0.4 mg by mouth at bedtime.   Yes Historical Provider, MD    Current Facility-Administered Medications  Medication Dose Route Frequency Provider Last Rate Last Dose  . 0.9 %  sodium chloride infusion   Intravenous Continuous Allie Bossier, MD 125 mL/hr at 05/28/14 [phone]    . latanoprost (XALATAN) 0.005 % ophthalmic solution 1 drop  1 drop Both Eyes QHS Etta Quill, DO   1 drop at 05/27/14 2214  . meclizine (ANTIVERT) tablet 25 mg  25 mg Oral QHS PRN Etta Quill, DO      . multivitamin with minerals tablet 1 tablet  1 tablet Oral QHS Etta Quill, DO   1 tablet at 05/27/14 2214  . pantoprazole (PROTONIX) injection 40 mg  40 mg Intravenous Q12H Allie Bossier, MD   40 mg at 05/28/14 0346  . sodium chloride 0.9 % injection 3 mL  3 mL Intravenous Q12H Etta Quill, DO   3 mL at 05/27/14 2219  . tamsulosin (FLOMAX) capsule 0.4 mg  0.4 mg Oral QHS Etta Quill, DO   0.4 mg at 05/27/14 2214    Allergies as of 05/27/2014  . (No Known Allergies)  No family history on file.  History   Social History  . Marital Status: Married    Spouse Name: N/A    Number of Children: N/A  . Years of Education: N/A   Occupational History  . Not on file.   Social History Main Topics  . Smoking status: Never Smoker   . Smokeless tobacco: Never Used  . Alcohol Use: No  . Drug Use: No  . Sexual Activity: No   Other Topics Concern  . Not on file   Social History Narrative  . No narrative on file    Review of Systems: Ten point ROS is O/W negative except as mentioned in HPI.  Physical Exam: Vital signs in last 24 hours: Temp:  [97.4 F (36.3 C)-98 F (36.7 C)] 97.4 F (36.3 C) (08/24 0603) Pulse Rate:  [64-85] 78 (08/24 0603) Resp:  [13-17] 17 (08/24 0603) BP: (98-159)/(50-87) 101/50 mmHg (08/24 0603) SpO2:  [96 %-100 %] 99  % (08/24 0603) Weight:  [159 lb 3.2 oz (72.213 kg)-164 lb (74.39 kg)] 159 lb 4.8 oz (72.258 kg) (08/24 0603) Last BM Date: 05/28/14 General:  Alert, Well-developed, well-nourished, pleasant and cooperative in NAD Head:  Normocephalic and atraumatic. Eyes:  Sclera clear, no icterus.  Conjunctiva pink. Ears:  Normal auditory acuity. Mouth:  No deformity or lesions.   Lungs:  Clear throughout to auscultation.  No wheezes, crackles, or rhonchi.  Heart:  Regular rate and rhythm. Abdomen:  Soft, non-distended.  BS present.  Non-tender.   Rectal:  No external abnormalities identified.  DRE did not reveal any masses.  Red blood noted on exam glove.    Msk:  Symmetrical without gross deformities. Pulses:  Normal pulses noted. Extremities:  Without clubbing or edema. Neurologic:  Alert and  oriented x4;  grossly normal neurologically. Skin:  Intact without significant lesions or rashes. Psych:  Alert and cooperative. Normal mood and affect.  Intake/Output from previous day: 08/23 0701 - 08/24 0700 In: 1528.8 [P.O.:720; I.V.:808.8] Out: 300 [Urine:300] Intake/Output this shift: Total I/O In: 360 [P.O.:360] Out: 225 [Urine:225]  Lab Results:  Recent Labs  05/27/14 1722 05/28/14 0025 05/28/14 0401  WBC 7.6  --  6.7  HGB 14.4 13.3 12.1*  HCT 43.4 38.8* 37.1*  PLT 234  --  215   BMET  Recent Labs  05/27/14 1722 05/28/14 0401  NA 139 137  K 4.2 4.6  CL 102 103  CO2 24 22  GLUCOSE 114* 155*  BUN 17 18  CREATININE 1.42* 1.32  CALCIUM 9.3 8.6   LFT  Recent Labs  05/28/14 0401  PROT 6.1  ALBUMIN 3.4*  AST 25  ALT 15  ALKPHOS 65  BILITOT 0.7   Studies/Results: Ct Abdomen Pelvis W Contrast  05/27/2014   CLINICAL DATA:  Dark red blood in stool today.  Weakness.  EXAM: CT ABDOMEN AND PELVIS WITH CONTRAST  TECHNIQUE: Multidetector CT imaging of the abdomen and pelvis was performed using the standard protocol following bolus administration of intravenous contrast.   CONTRAST:  114mL OMNIPAQUE IOHEXOL 300 MG/ML  SOLN  COMPARISON:  Abdominal pelvic CT 02/17/2012.  FINDINGS: Lung bases: Stable mild atelectasis at both lung bases. No significant pleural or pericardial effusion.  Liver/Biliary/Pancreas: Multiple low density hepatic lesions are re- demonstrated. Most of these are well circumscribed and consistent with incidental cysts. There is a 2.1 cm lesion in the dome of the right hepatic lobe which does not appear to reflect a simple cyst, although is unchanged. No evidence of  gallstones, gallbladder wall thickening or biliary dilatation. The pancreas appears normal.  Spleen/Adrenal glands: Unremarkable.  Kidneys/Ureters/Bladder: Bilateral renal cysts are again noted. There are some calcifications within the wall of a 5.4 cm right renal cyst, but these are unchanged. There is no evidence of solid renal mass or hydronephrosis. There is no evidence of urinary tract calculus. The bladder appears normal.  Bowel/Peritoneum: The stomach, small bowel, appendix and colon demonstrate no acute findings. There appears to be a diverticulum of the terminal ileum without surrounding inflammation. There are multiple colonic diverticula. There is some fluid within the rectum. No bowel wall thickening is apparent.  Retroperitoneum/Pelvis: There are no enlarged abdominal or pelvic lymph nodes. Stable aortoiliac atherosclerosis. The prostate gland is moderately enlarged.  Abdominal wall: No abdominal wall masses or hernias.  Musculoskeletal: No acute or significant osseus findings. There are degenerative changes throughout the spine.  IMPRESSION: 1. No acute findings or clear explanation for GI bleeding. Patient does have multiple colonic diverticula, a likely source of bright red blood per rectum. 2. Stable hepatic and renal cysts. 3. Stable moderate enlargement of the prostate gland.   Electronically Signed   By: Camie Patience M.D.   On: 05/27/2014 19:59    IMPRESSION:  -GI bleeding:  Sounds  like a diverticular bleed.  CT scan does not show any masses, although not completely excluded.  Differential also includes AVM.  Hgb down 2 grams since admission, but this is likely to be somewhat dilutional.  PLAN: -Monitor Hgb. -Will likely just observe for resolution of bleeding but final plan per Dr. Ardis Hughs.   ZEHR, JESSICA D.  05/28/2014, 8:51 AM  Pager number [phone]     ________________________________________________________________________  Velora Heckler GI MD note:  I personally examined the patient, reviewed the data and agree with the assessment and plan described above.  Bleeding seems to have stopped, Hb 12 currently.  Will observe another 24 hours and if no recurrent bleeding he is likely OK to go home.   Owens Loffler, MD [facility] Pager [phone]

## 2014-05-28 NOTE — Progress Notes (Signed)
Pt had a syncopal episode for about a second while pt was sitting on the side of the bed after pt came to got a manual BP of 114/58, pt did state he felt dizzy before passing out, pt is here with GI bleed and just had a BM 30 min to 1hr before the episode, called rapid response to check out pt, pt stated he was ok, RN told pt to call if had to get up for anything, notified MD, MD ordered increase in IV fluids, will continue to monitor, thanks [place]

## 2014-05-29 DIAGNOSIS — I951 Orthostatic hypotension: Secondary | ICD-10-CM | POA: Diagnosis not present

## 2014-05-29 LAB — BASIC METABOLIC PANEL
ANION GAP: 8 (ref 5–15)
BUN: 15 mg/dL (ref 6–23)
CHLORIDE: 111 meq/L (ref 96–112)
CO2: 24 mEq/L (ref 19–32)
Calcium: 8.4 mg/dL (ref 8.4–10.5)
Creatinine, Ser: 1.42 mg/dL — ABNORMAL HIGH (ref 0.50–1.35)
GFR calc non Af Amer: 42 mL/min — ABNORMAL LOW (ref >90)
GFR, EST AFRICAN AMERICAN: 49 mL/min — AB (ref >90)
Glucose, Bld: 108 mg/dL — ABNORMAL HIGH (ref 70–99)
Potassium: 4.4 mEq/L (ref 3.7–5.3)
Sodium: 143 mEq/L (ref 137–147)

## 2014-05-29 LAB — CBC
HEMATOCRIT: 32.6 % — AB (ref 39.0–52.0)
Hemoglobin: 11.2 g/dL — ABNORMAL LOW (ref 13.0–17.0)
MCH: 31.6 pg (ref 26.0–34.0)
MCHC: 34.4 g/dL (ref 30.0–36.0)
MCV: 92.1 fL (ref 78.0–100.0)
PLATELETS: 205 10*3/uL (ref 150–400)
RBC: 3.54 MIL/uL — ABNORMAL LOW (ref 4.22–5.81)
RDW: 13.3 % (ref 11.5–15.5)
WBC: 6 10*3/uL (ref 4.0–10.5)

## 2014-05-29 MED ORDER — PEG-KCL-NACL-NASULF-NA ASC-C 100 G PO SOLR
1.0000 | Freq: Once | ORAL | Status: AC
  Administered 2014-05-29: 200 g via ORAL
  Filled 2014-05-29: qty 1

## 2014-05-29 MED ORDER — SODIUM CHLORIDE 0.9 % IV SOLN
INTRAVENOUS | Status: AC
  Administered 2014-05-29 – 2014-05-30 (×2): via INTRAVENOUS

## 2014-05-29 MED ORDER — PEG-KCL-NACL-NASULF-NA ASC-C 100 G PO SOLR
1.0000 | Freq: Once | ORAL | Status: AC
  Administered 2014-05-30: 200 g via ORAL
  Filled 2014-05-29 (×2): qty 1

## 2014-05-29 MED ORDER — PEG-KCL-NACL-NASULF-NA ASC-C 100 G PO SOLR: 1.0000 | Freq: Once | ORAL | Status: DC

## 2014-05-29 NOTE — Progress Notes (Signed)
Report given to receiving RN. Patient in bed resting. No verbal complaints. No signs or symptoms of distress or discomfort noted.

## 2014-05-29 NOTE — Progress Notes (Signed)
[facility] Progress Note    Since last GI note: Only a small amount of red blood on stool last night.  None this AM with BM.    Objective: Vital signs in last 24 hours: Temp:  [97.4 F (36.3 C)-98.2 F (36.8 C)] 98.2 F (36.8 C) (08/25 0514) Pulse Rate:  [80-90] 89 (08/25 0515) Resp:  [18] 18 (08/25 0514) BP: (94-127)/(46-67) 94/54 mmHg (08/25 0516) SpO2:  [97 %-99 %] 99 % (08/25 0228) Weight:  [160 lb 1.6 oz (72.621 kg)] 160 lb 1.6 oz (72.621 kg) (08/25 0519) Last BM Date: 05/28/14 General: alert and oriented times 3 Heart: regular rate and rythm Abdomen: soft, non-tender, non-distended, normal bowel sounds   Lab Results:  Recent Labs  05/28/14 0401 05/28/14 1704 05/29/14 0427  WBC 6.7 5.2 6.0  HGB 12.1* 11.2* 11.2*  PLT 215 295 205  MCV 91.8 91.1 92.1    Recent Labs  05/27/14 1722 05/28/14 0401 05/29/14 0427  NA 139 137 143  K 4.2 4.6 4.4  CL 102 103 111  CO2 24 22 24   GLUCOSE 114* 155* 108*  BUN 17 18 15   CREATININE 1.42* 1.32 1.42*  CALCIUM 9.3 8.6 8.4    Recent Labs  05/28/14 0401  PROT 6.1  ALBUMIN 3.4*  AST 25  ALT 15  ALKPHOS 65  BILITOT 0.7   Medications: Scheduled Meds: . latanoprost  1 drop Both Eyes QHS  . multivitamin with minerals  1 tablet Oral QHS  . pantoprazole (PROTONIX) IV  40 mg Intravenous Q12H  . sodium chloride  3 mL Intravenous Q12H  . tamsulosin  0.4 mg Oral QHS   Continuous Infusions: . sodium chloride 125 mL/hr at 05/29/14 0233   PRN Meds:.meclizine    Assessment/Plan: 80 y.o. male with Gi bleeding, presumed diverticular  We discussed colonoscopy.  Although this was probably a diverticular related bleeding, cannot rule out neoplasm as a cause and he thinks a brother of his had colon cancer. He has not had colonsocopy in 15 years.  I offered doing this while inpatient (tomorrow) or as outpatient if he prefers.  He is going to discuss with his wife and get back.  He is leaning towards doing as  inpatient and I will go ahead and start setting it up, will cancel prep/colo if he decides to go home today and do procedure as outpatient.    Milus Banister, MD  05/29/2014, 8:58 AM [facility] Pager [phone]

## 2014-05-29 NOTE — Progress Notes (Signed)
PROGRESS NOTE    Jerry Berry VQQ:[id number] DOB: 02-14-1922 DOA: 05/27/2014 PCP: Foye Spurling, MD  HPI/Brief narrative 80 year old male patient with history of hypertension, apparent previous diverticular bleed, last EGD/colonoscopy 15 years ago, presented to the ED with bright red bleeding per rectum. CT abdomen shows multiple colonic diverticula which is the likely cause. Patient had syncopal episode x1 early 8/24 AM. GI consulted and managed conservatively. Bleeding seems to have stopped. GI planning colonoscopy 8/26.   Assessment/Plan:  1. Acute lower GI bleeding: Likely diverticular. Denies abdominal or rectal pain. States he had previous episode of such rectal bleeding which resolved spontaneously. Last colonoscopy/EGD apparently 15 years ago. GI consulted and patient was managed conservatively. Treated supportively with IV fluids, serial CBC monitoring and transfuse as needed. Hold aspirin. Bleeding seems to have stopped. GI discussed with patient and recommends colonoscopy to rule out other etiology i.e. neoplasm. Colonoscopy scheduled for 8/26. 2. Syncope x1/orthostatic hypotension: Likely from hypotension/orthostatic hypotension related to GI bleed. Orthostatic blood pressures positive: BP 126 lying >94/54 standing. Continue IV fluids. Monitor. Held antihypertensives. 3. Acute post hemorrhagic anemia: Unclear baseline hemoglobin. Initial hemoglobin in ED was 14.4 which may be spuriously high from hemoconcentration. Follow CBCs closely and transfuse if hemoglobin less than 8 g per DL. Hemoglobin stable in the 11.2 g range. Follow CBC in a.m. 4. Hypertension: Soft blood pressures. Hold metoprolol. 5. Stage III chronic kidney disease: Creatinine stable. Follow BMP. 6. Prostatomegaly:   Code Status:  Full Family Communication:  None at bedside. Disposition Plan:  Home when medically stable.   Consultants:   Gastroenterology.  Procedures:   None  Antibiotics:   None    Subjective: States that he had 3 BMs in the last 24 hours & the last one was last night with minimal blood. Denies dizziness or lightheadedness.  Objective: Filed Vitals:   05/29/14 0515 05/29/14 0516 05/29/14 0519 05/29/14 1431  BP: 109/66 94/54  150/79  Pulse: 89   98  Temp:    97.8 F (36.6 C)  TempSrc:    Oral  Resp:    18  Height:      Weight:   72.621 kg (160 lb 1.6 oz)   SpO2:    99%    Intake/Output Summary (Last 24 hours) at 05/29/14 1530 Last data filed at 05/29/14 1300  Gross per 24 hour  Intake 1029.58 ml  Output   1700 ml  Net -670.42 ml   Filed Weights   05/27/14 2130 05/28/14 0603 05/29/14 0519  Weight: 72.213 kg (159 lb 3.2 oz) 72.258 kg (159 lb 4.8 oz) 72.621 kg (160 lb 1.6 oz)     Exam:  General exam:  Pleasant elderly male sitting up comfortably in bed. Respiratory system: Clear. No increased work of breathing. Cardiovascular system: S1 & S2 heard, RRR. No JVD, murmurs, gallops, clicks or pedal edema. telemetry: Sinus rhythm. Gastrointestinal system: Abdomen is nondistended, soft and nontender. Normal bowel sounds heard. Central nervous system: Alert and oriented. No focal neurological deficits. Extremities: Symmetric 5 x 5 power.   Data Reviewed: Basic Metabolic Panel:  Recent Labs Lab 05/27/14 1722 05/28/14 0401 05/29/14 0427  NA 139 137 143  K 4.2 4.6 4.4  CL 102 103 111  CO2 24 22 24   GLUCOSE 114* 155* 108*  BUN 17 18 15   CREATININE 1.42* 1.32 1.42*  CALCIUM 9.3 8.6 8.4   Liver Function Tests:  Recent Labs Lab 05/28/14 0401  AST 25  ALT 15  ALKPHOS 65  BILITOT  0.7  PROT 6.1  ALBUMIN 3.4*   No results found for this basename: LIPASE, AMYLASE,  in the last 168 hours No results found for this basename: AMMONIA,  in the last 168 hours CBC:  Recent Labs Lab 05/27/14 1722 05/28/14 0025 05/28/14 0401 05/28/14 1704 05/29/14 0427  WBC 7.6  --  6.7 5.2 6.0  NEUTROABS 4.2  --   --   --   --   HGB 14.4 13.3 12.1* 11.2*  11.2*  HCT 43.4 38.8* 37.1* 31.8* 32.6*  MCV 91.2  --  91.8 91.1 92.1  PLT 234  --  215 295 205   Cardiac Enzymes:  Recent Labs Lab 05/27/14 1722  TROPONINI <0.30   BNP (last 3 results) No results found for this basename: PROBNP,  in the last 8760 hours CBG: No results found for this basename: GLUCAP,  in the last 168 hours  No results found for this or any previous visit (from the past 240 hour(s)).     Studies: Ct Abdomen Pelvis W Contrast  05/27/2014   CLINICAL DATA:  Dark red blood in stool today.  Weakness.  EXAM: CT ABDOMEN AND PELVIS WITH CONTRAST  TECHNIQUE: Multidetector CT imaging of the abdomen and pelvis was performed using the standard protocol following bolus administration of intravenous contrast.  CONTRAST:  182m OMNIPAQUE IOHEXOL 300 MG/ML  SOLN  COMPARISON:  Abdominal pelvic CT 02/17/2012.  FINDINGS: Lung bases: Stable mild atelectasis at both lung bases. No significant pleural or pericardial effusion.  Liver/Biliary/Pancreas: Multiple low density hepatic lesions are re- demonstrated. Most of these are well circumscribed and consistent with incidental cysts. There is a 2.1 cm lesion in the dome of the right hepatic lobe which does not appear to reflect a simple cyst, although is unchanged. No evidence of gallstones, gallbladder wall thickening or biliary dilatation. The pancreas appears normal.  Spleen/Adrenal glands: Unremarkable.  Kidneys/Ureters/Bladder: Bilateral renal cysts are again noted. There are some calcifications within the wall of a 5.4 cm right renal cyst, but these are unchanged. There is no evidence of solid renal mass or hydronephrosis. There is no evidence of urinary tract calculus. The bladder appears normal.  Bowel/Peritoneum: The stomach, small bowel, appendix and colon demonstrate no acute findings. There appears to be a diverticulum of the terminal ileum without surrounding inflammation. There are multiple colonic diverticula. There is some fluid  within the rectum. No bowel wall thickening is apparent.  Retroperitoneum/Pelvis: There are no enlarged abdominal or pelvic lymph nodes. Stable aortoiliac atherosclerosis. The prostate gland is moderately enlarged.  Abdominal wall: No abdominal wall masses or hernias.  Musculoskeletal: No acute or significant osseus findings. There are degenerative changes throughout the spine.  IMPRESSION: 1. No acute findings or clear explanation for GI bleeding. Patient does have multiple colonic diverticula, a likely source of bright red blood per rectum. 2. Stable hepatic and renal cysts. 3. Stable moderate enlargement of the prostate gland.   Electronically Signed   By: BCamie PatienceM.D.   On: 05/27/2014 19:59        Scheduled Meds: . latanoprost  1 drop Both Eyes QHS  . multivitamin with minerals  1 tablet Oral QHS  . peg 3350 powder  1 kit Oral Once   Followed by  . [START ON 05/30/2014] peg 3350 powder  1 kit Oral Once  . sodium chloride  3 mL Intravenous Q12H  . tamsulosin  0.4 mg Oral QHS   Continuous Infusions:    Principal Problem:   BRBPR (  bright red blood per rectum) Active Problems:   HYPERTENSION, UNSPECIFIED   CKD (chronic kidney disease) stage 3, GFR 30-59 ml/min   Syncope and collapse   Acute post-hemorrhagic anemia   Essential hypertension, benign   Stage III chronic kidney disease    Time spent:  57 minutes    HONGALGI,ANAND, MD, FACP, FHM. [facility] Pager [phone]  If 7PM-7AM, please contact night-coverage [email or web address] Password [email or web address] 05/29/2014, 3:30 PM    LOS: 2 days

## 2014-05-30 ENCOUNTER — Encounter (HOSPITAL_COMMUNITY): Payer: Self-pay | Admitting: Anesthesiology

## 2014-05-30 ENCOUNTER — Inpatient Hospital Stay (HOSPITAL_COMMUNITY): Payer: PRIVATE HEALTH INSURANCE | Admitting: Anesthesiology

## 2014-05-30 ENCOUNTER — Encounter (HOSPITAL_COMMUNITY): Payer: PRIVATE HEALTH INSURANCE | Admitting: Anesthesiology

## 2014-05-30 ENCOUNTER — Encounter (HOSPITAL_COMMUNITY)
Admission: EM | Disposition: A | Discharge status: Home / Self Care | Payer: Self-pay | Source: Home / Self Care | Attending: Internal Medicine

## 2014-05-30 DIAGNOSIS — N400 Enlarged prostate without lower urinary tract symptoms: Secondary | ICD-10-CM

## 2014-05-30 HISTORY — PX: COLONOSCOPY: SHX5424

## 2014-05-30 LAB — CBC
HCT: 33.6 % — ABNORMAL LOW (ref 39.0–52.0)
HEMOGLOBIN: 11.3 g/dL — AB (ref 13.0–17.0)
MCH: 31.1 pg (ref 26.0–34.0)
MCHC: 33.6 g/dL (ref 30.0–36.0)
MCV: 92.6 fL (ref 78.0–100.0)
Platelets: 221 10*3/uL (ref 150–400)
RBC: 3.63 MIL/uL — ABNORMAL LOW (ref 4.22–5.81)
RDW: 13.4 % (ref 11.5–15.5)
WBC: 6.7 10*3/uL (ref 4.0–10.5)

## 2014-05-30 LAB — BASIC METABOLIC PANEL
Anion gap: 13 (ref 5–15)
BUN: 12 mg/dL (ref 6–23)
CHLORIDE: 114 meq/L — AB (ref 96–112)
CO2: 19 mEq/L (ref 19–32)
CREATININE: 1.39 mg/dL — AB (ref 0.50–1.35)
Calcium: 9.3 mg/dL (ref 8.4–10.5)
GFR calc Af Amer: 50 mL/min — ABNORMAL LOW (ref >90)
GFR, EST NON AFRICAN AMERICAN: 43 mL/min — AB (ref >90)
Glucose, Bld: 127 mg/dL — ABNORMAL HIGH (ref 70–99)
POTASSIUM: 4.9 meq/L (ref 3.7–5.3)
Sodium: 146 mEq/L (ref 137–147)

## 2014-05-30 SURGERY — COLONOSCOPY
Anesthesia: Monitor Anesthesia Care

## 2014-05-30 MED ORDER — PEG 3350-KCL-NA BICARB-NACL 420 G PO SOLR
4000.0000 mL | Freq: Once | ORAL | Status: AC
  Administered 2014-05-30: 4000 mL via ORAL
  Filled 2014-05-30: qty 4000

## 2014-05-30 MED ORDER — PROPOFOL INFUSION 10 MG/ML OPTIME
INTRAVENOUS | Status: DC | PRN
  Administered 2014-05-30: 100 ug/kg/min via INTRAVENOUS

## 2014-05-30 MED ORDER — PHENYLEPHRINE HCL 10 MG/ML IJ SOLN
INTRAMUSCULAR | Status: DC | PRN
  Administered 2014-05-30 (×2): 80 ug via INTRAVENOUS

## 2014-05-30 MED ORDER — FLEET ENEMA 7-19 GM/118ML RE ENEM
2.0000 | ENEMA | Freq: Once | RECTAL | Status: DC
  Filled 2014-05-30: qty 2

## 2014-05-30 MED ORDER — METOPROLOL SUCCINATE ER 25 MG PO TB24
25.0000 mg | ORAL_TABLET | Freq: Every day | ORAL | Status: DC
  Administered 2014-05-30: 25 mg via ORAL
  Filled 2014-05-30 (×2): qty 1

## 2014-05-30 MED ORDER — ONDANSETRON HCL 4 MG/2ML IJ SOLN
INTRAMUSCULAR | Status: DC | PRN
  Administered 2014-05-30: 4 mg via INTRAVENOUS

## 2014-05-30 MED ORDER — SODIUM CHLORIDE 0.9 % IV SOLN
INTRAVENOUS | Status: DC
  Administered 2014-05-30: 08:00:00 via INTRAVENOUS

## 2014-05-30 MED ORDER — LACTATED RINGERS IV SOLN
INTRAVENOUS | Status: DC | PRN
  Administered 2014-05-30: 13:00:00 via INTRAVENOUS

## 2014-05-30 MED ORDER — FENTANYL CITRATE 0.05 MG/ML IJ SOLN
INTRAMUSCULAR | Status: DC | PRN
  Administered 2014-05-30: 50 ug via INTRAVENOUS
  Administered 2014-05-30: 25 ug via INTRAVENOUS

## 2014-05-30 MED ORDER — LACTATED RINGERS IV SOLN
INTRAVENOUS | Status: DC
Start: 2014-05-30 — End: 2014-05-30
  Administered 2014-05-30: 1000 mL via INTRAVENOUS

## 2014-05-30 NOTE — Anesthesia Preprocedure Evaluation (Addendum)
Anesthesia Evaluation  Patient identified by MRN, date of birth, ID band Patient awake    Reviewed: Allergy & Precautions, H&P , NPO status , Patient's Chart, lab work & pertinent test results, reviewed documented beta blocker date and time   Airway Mallampati: II TM Distance: >3 FB Neck ROM: Full    Dental no notable dental hx. (+) Teeth Intact, Poor Dentition   Pulmonary pneumonia -, resolved,  breath sounds clear to auscultation  Pulmonary exam normal       Cardiovascular Exercise Tolerance: Good hypertension, Pt. on medications and Pt. on home beta blockers negative cardio ROS  Rhythm:Regular Rate:Normal     Neuro/Psych negative neurological ROS  negative psych ROS   GI/Hepatic negative GI ROS, Neg liver ROS,   Endo/Other  negative endocrine ROS  Renal/GU Renal InsufficiencyRenal disease  negative genitourinary   Musculoskeletal negative musculoskeletal ROS (+)   Abdominal Normal abdominal exam  (+)   Peds negative pediatric ROS (+)  Hematology negative hematology ROS (+) anemia ,   Anesthesia Other Findings   Reproductive/Obstetrics negative OB ROS                          Anesthesia Physical Anesthesia Plan  ASA: III  Anesthesia Plan: MAC   Post-op Pain Management:    Induction: Intravenous  Airway Management Planned: Mask  Additional Equipment:   Intra-op Plan:   Post-operative Plan:   Informed Consent: I have reviewed the patients History and Physical, chart, labs and discussed the procedure including the risks, benefits and alternatives for the proposed anesthesia with the patient or authorized representative who has indicated his/her understanding and acceptance.   Dental advisory given  Plan Discussed with: CRNA, Anesthesiologist and Surgeon  Anesthesia Plan Comments:        Anesthesia Quick Evaluation

## 2014-05-30 NOTE — Care Management Note (Addendum)
  Page 1 of 1   05/30/2014     3:00:45 PM CARE MANAGEMENT NOTE 05/30/2014  Patient:  Jerry Berry, Jerry Berry   Account Number:  [id number]  Date Initiated:  05/30/2014  Documentation initiated by:  Mariann Laster  Subjective/Objective Assessment:   Rectal bleeding     Action/Plan:   CM to follow for disposition needs   Anticipated DC Date:  05/30/2014   Anticipated DC Plan:  [place]  CM consult      Choice offered to / List presented to:  NA           Status of service:  Completed, signed off Medicare Important Message given?  YES (If response is "NO", the following Medicare IM given date fields will be blank) Date Medicare IM given:  05/30/2014 Medicare IM given by:  Kevork Joyce Date Additional Medicare IM given:   Additional Medicare IM given by:    Discharge Disposition:  HOME/SELF CARE  Per UR Regulation:  Reviewed for med. necessity/level of care/duration of stay  If discussed at [place] of Stay Meetings, dates discussed:    Comments:  Jaymeson Mengel RN, BSN, MSHL, CCM  Nurse - Case Manager,  ([facility] [place])  [phone]  05/30/2014 ADM:  Rectal bleeding/GI Bleed Social:  80yo from home with wife.  Patient HOH and has no hearing aids.  Hx/o past HHS in the home for wife with [facility] Patient elects the same provider if services needed. Colonoscopy 05/30/2014 PCP:  Dr. Don Broach. [place] CM provided patient education on f/u with health plan for hearing screening and hearing aids coverage.  Patient states other priroities always get in the way of getting this done. Disposition Plan:  Home /Self care (no HHS needs identified at this time)

## 2014-05-30 NOTE — Op Note (Signed)
[facility] [place], [address]   COLONOSCOPY PROCEDURE REPORT  PATIENT: Jerry Berry, Jerry Berry  MR#: [id number] BIRTHDATE: 23-Jul-1922 , 22  yrs. old GENDER: Male ENDOSCOPIST: Milus Banister, MD PROCEDURE DATE:  05/30/2014 PROCEDURE:   Colonoscopy, diagnostic First Screening Colonoscopy - Avg.  risk and is 50 yrs.  old or older - No.  Prior Negative Screening - Now for repeat screening. N/A  History of Adenoma - Now for follow-up colonoscopy & has been > or = to 3 yrs.  N/A  Polyps Removed Today? No.  Recommend repeat exam, <10 yrs? No. ASA CLASS:   Class III INDICATIONS:over rectal bleeding, presumed diverticular, last colonoscopy was 15 years ago. MEDICATIONS: MAC sedation, administered by CRNA  DESCRIPTION OF PROCEDURE:   After the risks benefits and alternatives of the procedure were thoroughly explained, informed consent was obtained.  A digital rectal exam revealed no abnormalities of the rectum.   The [employer]ax Adult Colon [id number] endoscope was introduced through the anus and advanced to the cecum, which was identified by both the appendix and ileocecal valve. No adverse events experienced.   The quality of the prep was adequate.  The instrument was then slowly withdrawn as the colon was fully examined.  COLON FINDINGS: There was no blood in cecum, ascending colon, transverse colon.  There was fairly fresh red blood from splenic flexure to the rectum.  There were numerous diverticulum throughout the colon however these were most numerous, densly packed in the descending/sigmoid region.  Using suction, copious flushing I was able to clear all the recent blood out of the left colon but could not identify an active bleeding site.  The bleeding must have stopped shortly before the colonoscopy began.  The examination was otherwise normal.  Retroflexed views revealed no abnormalities. The time to cecum=3 minutes 00 seconds.  Withdrawal time=20  minutes 00 seconds.  The scope was withdrawn and the procedure completed. COMPLICATIONS: There were no complications.  ENDOSCOPIC IMPRESSION: There was recent blood in left colon, none in transverse or right colon.  I presume the bleeding is from left sided divertculum but none were found to be actively bleeding during this procedure. There were a few scattered diverticulum in transverse and right colon but in descending/sigmoid the diverticulum were large and densely packed.  RECOMMENDATIONS: Follow clinically for now, would not discharge unless he has no overt bleeding for at least 24 hours.  If + overt rebleeding, he will need stat nuc medicine bleeding scan.  eSigned:  Milus Banister, MD 05/30/2014 2:12 PM   cc: Alben Spittle, MD

## 2014-05-30 NOTE — Interval H&P Note (Signed)
History and Physical Interval Note:  05/30/2014 1:14 PM  Jerry Berry  has presented today for surgery, with the diagnosis of gi bleeding  The various methods of treatment have been discussed with the patient and family. After consideration of risks, benefits and other options for treatment, the patient has consented to  Procedure(s): COLONOSCOPY (N/A) as a surgical intervention .  The patient's history has been reviewed, patient examined, no change in status, stable for surgery.  I have reviewed the patient's chart and labs.  Questions were answered to the patient's satisfaction.     Milus Banister

## 2014-05-30 NOTE — Progress Notes (Signed)
Report given to receiving RN. Patient in bed sleeping. No signs or symptoms of distress or discomfort noted.

## 2014-05-30 NOTE — Progress Notes (Signed)
PROGRESS NOTE    Jerry Berry GYK:[id number] DOB: 17-Nov-1921 DOA: 05/27/2014 PCP: Foye Spurling, MD  HPI/Brief narrative 80 year old male patient with history of hypertension, apparent previous diverticular bleed, last EGD/colonoscopy 15 years ago, presented to the ED with bright red bleeding per rectum. CT abdomen shows multiple colonic diverticula which is the likely cause. Patient had syncopal episode x1 early 8/24 AM. GI consulted and managed conservatively. Bleeding seems to have stopped. GI planning colonoscopy 8/26.   Assessment/Plan: 1. Acute lower GI bleeding: Likely diverticular.      -slowing slight bleeding with prep noted      -GI following for colonoscopy today  2. Syncope x1/orthostatic hypotension: Likely from hypotension/orthostatic hypotension related to GI bleed.        -stop IVF, BP imporved  3. Acute post hemorrhagic anemia: Unclear baseline hemoglobin. Initial hemoglobin in ED was 14.4 which may be spuriously high from hemoconcentration. Hb stable in 11 range, CBC in am  4. Hypertension: stable BP, resume metoprolol.  5. Stage III chronic kidney disease: Creatinine stable  6. BPH: continue flomax  DVT proph: SCDs  Code Status:  Full Family Communication:  None at bedside. Disposition Plan:  Home tomorrow if stable   Consultants:   Gastroenterology.  Procedures:  Colonsocopy  Antibiotics:   None   Subjective: Had some dark red blood this am with the prep  Objective: Filed Vitals:   05/30/14 1410 05/30/14 1414 05/30/14 1425 05/30/14 1449  BP: 119/51  131/63 136/59  Pulse: 89 89 85 91  Temp:   97.8 F (36.6 C) 97.5 F (36.4 C)  TempSrc:   Oral Oral  Resp: 20 19 15 18   Height:      Weight:      SpO2: 100% 100% 100% 96%    Intake/Output Summary (Last 24 hours) at 05/30/14 1549 Last data filed at 05/30/14 1359  Gross per 24 hour  Intake   5575 ml  Output      0 ml  Net   5575 ml   Filed Weights   05/28/14 0603 05/29/14 0519  05/30/14 0556  Weight: 72.258 kg (159 lb 4.8 oz) 72.621 kg (160 lb 1.6 oz) 70.9 kg (156 lb 4.9 oz)     Exam:  General exam:  Pleasant elderly male sitting up comfortably in bed. Respiratory system: Clear. No increased work of breathing. Cardiovascular system: S1 & S2 heard, RRR. No JVD, murmurs, gallops, clicks or pedal edema. telemetry: Sinus rhythm. Gastrointestinal system: Abdomen is nondistended, soft and nontender. Normal bowel sounds heard. Central nervous system: Alert and oriented. No focal neurological deficits. Extremities: Symmetric 5 x 5 power.   Data Reviewed: Basic Metabolic Panel:  Recent Labs Lab 05/27/14 1722 05/28/14 0401 05/29/14 0427 05/30/14 0408  NA 139 137 143 146  K 4.2 4.6 4.4 4.9  CL 102 103 111 114*  CO2 24 22 24 19   GLUCOSE 114* 155* 108* 127*  BUN 17 18 15 12   CREATININE 1.42* 1.32 1.42* 1.39*  CALCIUM 9.3 8.6 8.4 9.3   Liver Function Tests:  Recent Labs Lab 05/28/14 0401  AST 25  ALT 15  ALKPHOS 65  BILITOT 0.7  PROT 6.1  ALBUMIN 3.4*   No results found for this basename: LIPASE, AMYLASE,  in the last 168 hours No results found for this basename: AMMONIA,  in the last 168 hours CBC:  Recent Labs Lab 05/27/14 1722 05/28/14 0025 05/28/14 0401 05/28/14 1704 05/29/14 0427 05/30/14 0408  WBC 7.6  --  6.7 5.2  6.0 6.7  NEUTROABS 4.2  --   --   --   --   --   HGB 14.4 13.3 12.1* 11.2* 11.2* 11.3*  HCT 43.4 38.8* 37.1* 31.8* 32.6* 33.6*  MCV 91.2  --  91.8 91.1 92.1 92.6  PLT 234  --  215 295 205 221   Cardiac Enzymes:  Recent Labs Lab 05/27/14 1722  TROPONINI <0.30   BNP (last 3 results) No results found for this basename: PROBNP,  in the last 8760 hours CBG: No results found for this basename: GLUCAP,  in the last 168 hours  No results found for this or any previous visit (from the past 240 hour(s)).     Studies: No results found.      Scheduled Meds: . latanoprost  1 drop Both Eyes QHS  . metoprolol  succinate  25 mg Oral QHS  . multivitamin with minerals  1 tablet Oral QHS  . sodium chloride  3 mL Intravenous Q12H  . tamsulosin  0.4 mg Oral QHS   Continuous Infusions:    Principal Problem:   BRBPR (bright red blood per rectum) Active Problems:   HYPERTENSION, UNSPECIFIED   CKD (chronic kidney disease) stage 3, GFR 30-59 ml/min   Syncope and collapse   Acute post-hemorrhagic anemia   Essential hypertension, benign   Stage III chronic kidney disease   Orthostatic hypotension    Time spent:  30 minutes    Domenic Polite, MD [facility] Pager [phone]  If 7PM-7AM, please contact night-coverage [email or web address] Password [facility] 05/30/2014, 3:49 PM    LOS: 3 days

## 2014-05-30 NOTE — Progress Notes (Signed)
Patient for colonoscopy this afternoon.  Drank all of his prep.  BM's still watery brown in color; no blood.

## 2014-05-30 NOTE — H&P (View-Only) (Signed)
[facility] Progress Note    Since last GI note: Only a small amount of red blood on stool last night.  None this AM with BM.    Objective: Vital signs in last 24 hours: Temp:  [97.4 F (36.3 C)-98.2 F (36.8 C)] 98.2 F (36.8 C) (08/25 0514) Pulse Rate:  [80-90] 89 (08/25 0515) Resp:  [18] 18 (08/25 0514) BP: (94-127)/(46-67) 94/54 mmHg (08/25 0516) SpO2:  [97 %-99 %] 99 % (08/25 0228) Weight:  [160 lb 1.6 oz (72.621 kg)] 160 lb 1.6 oz (72.621 kg) (08/25 0519) Last BM Date: 05/28/14 General: alert and oriented times 3 Heart: regular rate and rythm Abdomen: soft, non-tender, non-distended, normal bowel sounds   Lab Results:  Recent Labs  05/28/14 0401 05/28/14 1704 05/29/14 0427  WBC 6.7 5.2 6.0  HGB 12.1* 11.2* 11.2*  PLT 215 295 205  MCV 91.8 91.1 92.1    Recent Labs  05/27/14 1722 05/28/14 0401 05/29/14 0427  NA 139 137 143  K 4.2 4.6 4.4  CL 102 103 111  CO2 24 22 24   GLUCOSE 114* 155* 108*  BUN 17 18 15   CREATININE 1.42* 1.32 1.42*  CALCIUM 9.3 8.6 8.4    Recent Labs  05/28/14 0401  PROT 6.1  ALBUMIN 3.4*  AST 25  ALT 15  ALKPHOS 65  BILITOT 0.7   Medications: Scheduled Meds: . latanoprost  1 drop Both Eyes QHS  . multivitamin with minerals  1 tablet Oral QHS  . pantoprazole (PROTONIX) IV  40 mg Intravenous Q12H  . sodium chloride  3 mL Intravenous Q12H  . tamsulosin  0.4 mg Oral QHS   Continuous Infusions: . sodium chloride 125 mL/hr at 05/29/14 0233   PRN Meds:.meclizine    Assessment/Plan: 80 y.o. male with Gi bleeding, presumed diverticular  We discussed colonoscopy.  Although this was probably a diverticular related bleeding, cannot rule out neoplasm as a cause and he thinks a brother of his had colon cancer. He has not had colonsocopy in 15 years.  I offered doing this while inpatient (tomorrow) or as outpatient if he prefers.  He is going to discuss with his wife and get back.  He is leaning towards doing as  inpatient and I will go ahead and start setting it up, will cancel prep/colo if he decides to go home today and do procedure as outpatient.    Milus Banister, MD  05/29/2014, 8:58 AM [facility] Pager [phone]

## 2014-05-30 NOTE — Transfer of Care (Signed)
Immediate Anesthesia Transfer of Care Note  Patient: Jerry Berry  Procedure(s) Performed: Procedure(s): COLONOSCOPY (N/A)  Patient Location: PACU  Anesthesia Type:MAC  Level of Consciousness: awake, alert  and oriented  Airway & Oxygen Therapy: Patient Spontanous Breathing and Patient connected to nasal cannula oxygen  Post-op Assessment: Report given to PACU RN  Post vital signs: Reviewed and stable  Complications: No apparent anesthesia complications

## 2014-05-30 NOTE — Anesthesia Procedure Notes (Signed)
Procedure Name: MAC Date/Time: 05/30/2014 1:29 PM Performed by: Neldon Newport Pre-anesthesia Checklist: Patient identified, Patient being monitored, Timeout performed, Emergency Drugs available and Suction available Patient Re-evaluated:Patient Re-evaluated prior to induction

## 2014-05-31 ENCOUNTER — Encounter (HOSPITAL_COMMUNITY): Payer: Self-pay | Admitting: Gastroenterology

## 2014-05-31 DIAGNOSIS — I10 Essential (primary) hypertension: Secondary | ICD-10-CM

## 2014-05-31 DIAGNOSIS — K5731 Diverticulosis of large intestine without perforation or abscess with bleeding: Secondary | ICD-10-CM | POA: Diagnosis present

## 2014-05-31 LAB — CBC
HEMATOCRIT: 27.3 % — AB (ref 39.0–52.0)
Hemoglobin: 9.4 g/dL — ABNORMAL LOW (ref 13.0–17.0)
MCH: 32.3 pg (ref 26.0–34.0)
MCHC: 34.4 g/dL (ref 30.0–36.0)
MCV: 93.8 fL (ref 78.0–100.0)
Platelets: 182 10*3/uL (ref 150–400)
RBC: 2.91 MIL/uL — ABNORMAL LOW (ref 4.22–5.81)
RDW: 13.6 % (ref 11.5–15.5)
WBC: 5.9 10*3/uL (ref 4.0–10.5)

## 2014-05-31 MED ORDER — ASPIRIN 81 MG PO CHEW: 81.0000 mg | CHEWABLE_TABLET | Freq: Every day | ORAL | Status: AC

## 2014-05-31 NOTE — Progress Notes (Signed)
DC IV, DC Home. Non-Tele. Discharge instructions and home medications discussed with patient. Patient denied any questions or concerns at this time. Patient leaving unit via wheelchair and appears in no acute distress.

## 2014-05-31 NOTE — Discharge Summary (Signed)
Physician Discharge Summary  Jerry Berry ZJI:[id number] DOB: 03-02-1922 DOA: 05/27/2014  PCP: Foye Spurling, MD  Admit date: 05/27/2014 Discharge date: 05/31/2014  Time spent: 45 minutes  Recommendations for Outpatient Follow-up:  1. PCP Dr.Clark in 1 week 2. CBC in 1 week  Discharge Diagnoses:  Principal Problem:   BRBPR (bright red blood per rectum)   Diverticular bleeding   HYPERTENSION, UNSPECIFIED   CKD (chronic kidney disease) stage 3, GFR 30-59 ml/min   Syncope and collapse   Acute post-hemorrhagic anemia   Essential hypertension, benign   Stage III chronic kidney disease   Orthostatic hypotension   Diverticular hemorrhage   Discharge Condition: stable  Diet recommendation: low sodium  Filed Weights   05/29/14 0519 05/30/14 0556 05/31/14 0513  Weight: 72.621 kg (160 lb 1.6 oz) 70.9 kg (156 lb 4.9 oz) 71 kg (156 lb 8.4 oz)    History of present illness:  HPI: Jerry Berry is a 80 y.o. male who presents to the ED with 3-5 hour history of rectal bleeding. Has had 2 bloody BM at home. Presented to ED, had 3rd bloody BM in ED. Onset was sudden, quality is BRBPR, no associated CP nor SOB, does have some generalized weakness.   Hospital Course:  1. Acute lower GI bleeding: Likely diverticular.          -appears to have stopped at this time          -Was followed by GI, underwent colonoscopy with results as noted below, no further bleeding since yesterday    2. Syncope x1/orthostatic hypotension: Likely from hypotension/orthostatic hypotension related to GI bleed.             -BP improved, stable and symptomatic now            -no events on tele  3. Acute post hemorrhagic anemia: Unclear baseline hemoglobin. Initial hemoglobin in ED was 14.4 which may be spuriously high from hemoconcentration. Hb stable in 11 range, CBC in am  4. Hypertension: stable BP, resume metoprolol.  5. Stage III chronic kidney disease: Creatinine stable  6. BPH: continue  flomax  Procedures: COlonsocopy: ENDOSCOPIC IMPRESSION:  There was recent blood in left colon, none in transverse or right  Colon, bleeding suspected to be from left sided divertculum but  none were found to be actively bleeding during the procedure.  There were a few scattered diverticulum in transverse and right  colon but in descending/sigmoid the diverticulum were large  Consultations:  GI  Discharge Exam: Filed Vitals:   05/31/14 0513  BP: 105/59  Pulse: 78  Temp: 98.1 F (36.7 C)  Resp: 18    General: AAOx3 Cardiovascular: S1S2/RRR Respiratory: CTAB  Discharge Instructions You were cared for by a hospitalist during your hospital stay. If you have any questions about your discharge medications or the care you received while you were in the hospital after you are discharged, you can call the unit and asked to speak with the hospitalist on call if the hospitalist that took care of you is not available. Once you are discharged, your primary care physician will handle any further medical issues. Please note that NO REFILLS for any discharge medications will be authorized once you are discharged, as it is imperative that you return to your primary care physician (or establish a relationship with a primary care physician if you do not have one) for your aftercare needs so that they can reassess your need for medications and monitor your lab values.  Discharge Instructions  Diet - low sodium heart healthy    Complete by:  As directed      Increase activity slowly    Complete by:  As directed             Medication List         aspirin 81 MG chewable tablet  Chew 1 tablet (81 mg total) by mouth at bedtime. Restart in 1 week     latanoprost 0.005 % ophthalmic solution  Commonly known as:  XALATAN  Place 1 drop into both eyes at bedtime.     meclizine 25 MG tablet  Commonly known as:  ANTIVERT  Take 25 mg by mouth at bedtime as needed for dizziness.     metoprolol  tartrate 25 MG tablet  Commonly known as:  LOPRESSOR  Take 25 mg by mouth at bedtime.     multivitamin with minerals Tabs tablet  Take 1 tablet by mouth at bedtime.     tamsulosin 0.4 MG Caps capsule  Commonly known as:  FLOMAX  Take 0.4 mg by mouth at bedtime.       No Known Allergies     Follow-up Information   Follow up with Foye Spurling, MD. Schedule an appointment as soon as possible for a visit in 1 week.   Specialty:  Internal Medicine   Contact information:   [address] Kris Hartmann [place] [address] [phone]        The results of significant diagnostics from this hospitalization (including imaging, microbiology, ancillary and laboratory) are listed below for reference.    Significant Diagnostic Studies: Ct Abdomen Pelvis W Contrast  05/27/2014   CLINICAL DATA:  Dark red blood in stool today.  Weakness.  EXAM: CT ABDOMEN AND PELVIS WITH CONTRAST  TECHNIQUE: Multidetector CT imaging of the abdomen and pelvis was performed using the standard protocol following bolus administration of intravenous contrast.  CONTRAST:  175mL OMNIPAQUE IOHEXOL 300 MG/ML  SOLN  COMPARISON:  Abdominal pelvic CT 02/17/2012.  FINDINGS: Lung bases: Stable mild atelectasis at both lung bases. No significant pleural or pericardial effusion.  Liver/Biliary/Pancreas: Multiple low density hepatic lesions are re- demonstrated. Most of these are well circumscribed and consistent with incidental cysts. There is a 2.1 cm lesion in the dome of the right hepatic lobe which does not appear to reflect a simple cyst, although is unchanged. No evidence of gallstones, gallbladder wall thickening or biliary dilatation. The pancreas appears normal.  Spleen/Adrenal glands: Unremarkable.  Kidneys/Ureters/Bladder: Bilateral renal cysts are again noted. There are some calcifications within the wall of a 5.4 cm right renal cyst, but these are unchanged. There is no evidence of solid renal mass or  hydronephrosis. There is no evidence of urinary tract calculus. The bladder appears normal.  Bowel/Peritoneum: The stomach, small bowel, appendix and colon demonstrate no acute findings. There appears to be a diverticulum of the terminal ileum without surrounding inflammation. There are multiple colonic diverticula. There is some fluid within the rectum. No bowel wall thickening is apparent.  Retroperitoneum/Pelvis: There are no enlarged abdominal or pelvic lymph nodes. Stable aortoiliac atherosclerosis. The prostate gland is moderately enlarged.  Abdominal wall: No abdominal wall masses or hernias.  Musculoskeletal: No acute or significant osseus findings. There are degenerative changes throughout the spine.  IMPRESSION: 1. No acute findings or clear explanation for GI bleeding. Patient does have multiple colonic diverticula, a likely source of bright red blood per rectum. 2. Stable hepatic and renal cysts. 3. Stable moderate enlargement of the  prostate gland.   Electronically Signed   By: Camie Patience M.D.   On: 05/27/2014 19:59    Microbiology: No results found for this or any previous visit (from the past 240 hour(s)).   Labs: Basic Metabolic Panel:  Recent Labs Lab 05/27/14 1722 05/28/14 0401 05/29/14 0427 05/30/14 0408  NA 139 137 143 146  K 4.2 4.6 4.4 4.9  CL 102 103 111 114*  CO2 24 22 24 19   GLUCOSE 114* 155* 108* 127*  BUN 17 18 15 12   CREATININE 1.42* 1.32 1.42* 1.39*  CALCIUM 9.3 8.6 8.4 9.3   Liver Function Tests:  Recent Labs Lab 05/28/14 0401  AST 25  ALT 15  ALKPHOS 65  BILITOT 0.7  PROT 6.1  ALBUMIN 3.4*   No results found for this basename: LIPASE, AMYLASE,  in the last 168 hours No results found for this basename: AMMONIA,  in the last 168 hours CBC:  Recent Labs Lab 05/27/14 1722  05/28/14 0401 05/28/14 1704 05/29/14 0427 05/30/14 0408 05/31/14 0735  WBC 7.6  --  6.7 5.2 6.0 6.7 5.9  NEUTROABS 4.2  --   --   --   --   --   --   HGB 14.4  < >  12.1* 11.2* 11.2* 11.3* 9.4*  HCT 43.4  < > 37.1* 31.8* 32.6* 33.6* 27.3*  MCV 91.2  --  91.8 91.1 92.1 92.6 93.8  PLT 234  --  215 295 205 221 182  < > = values in this interval not displayed. Cardiac Enzymes:  Recent Labs Lab 05/27/14 1722  TROPONINI <0.30   BNP: BNP (last 3 results) No results found for this basename: PROBNP,  in the last 8760 hours CBG: No results found for this basename: GLUCAP,  in the last 168 hours     Signed:  Manan Olmo  [facility] 05/31/2014, 1:30 PM

## 2014-05-31 NOTE — Plan of Care (Signed)
Problem: Phase I Progression Outcomes Goal: Hemodynamically stable Outcome: Progressing VSS.  No observations of blood in stool.  Will continue to reassess and monitor.

## 2014-05-31 NOTE — Anesthesia Postprocedure Evaluation (Signed)
  Anesthesia Post-op Note  Patient: Jerry Berry  Procedure(s) Performed: Procedure(s) (LRB): COLONOSCOPY (N/A)  Patient Location: PACU  Anesthesia Type: MAC  Level of Consciousness: awake and alert   Airway and Oxygen Therapy: Patient Spontanous Breathing  Post-op Pain: mild  Post-op Assessment: Post-op Vital signs reviewed, Patient's Cardiovascular Status Stable, Respiratory Function Stable, Patent Airway and No signs of Nausea or vomiting  Last Vitals:  Filed Vitals:   05/31/14 0513  BP: 105/59  Pulse: 78  Temp: 36.7 C  Resp: 18    Post-op Vital Signs: stable   Complications: No apparent anesthesia complications

## 2014-05-31 NOTE — Progress Notes (Signed)
     [facility] Progress Note  Subjective:  Ate breakfast.  No BM or bleeding since colonoscopy.  Objective:  Vital signs in last 24 hours: Temp:  [97.5 F (36.4 C)-98.5 F (36.9 C)] 98.1 F (36.7 C) (08/27 0513) Pulse Rate:  [78-95] 78 (08/27 0513) Resp:  [15-25] 18 (08/27 0513) BP: (105-146)/(51-70) 105/59 mmHg (08/27 0513) SpO2:  [94 %-100 %] 96 % (08/27 0513) Weight:  [156 lb 8.4 oz (71 kg)] 156 lb 8.4 oz (71 kg) (08/27 0513) Last BM Date: 05/30/14 General:  Alert, Well-developed, in NAD Heart:  Regular rate and rhythm; no murmurs Pulm:  CTAB.  No W/R/R. Abdomen:  Soft, non-distended. Normal bowel sounds.  Non-tender. Extremities:  Without edema. Neurologic:  Alert and  oriented x4;  grossly normal neurologically. Psych:  Alert and cooperative. Normal mood and affect.  Intake/Output from previous day: 08/26 0701 - 08/27 0700 In: 1200 [P.O.:600; I.V.:600] Out: -  Intake/Output this shift: Total I/O In: 240 [P.O.:240] Out: -   Lab Results:  Recent Labs  05/29/14 0427 05/30/14 0408 05/31/14 0735  WBC 6.0 6.7 5.9  HGB 11.2* 11.3* 9.4*  HCT 32.6* 33.6* 27.3*  PLT 205 221 182   BMET  Recent Labs  05/29/14 0427 05/30/14 0408  NA 143 146  K 4.4 4.9  CL 111 114*  CO2 24 19  GLUCOSE 108* 127*  BUN 15 12  CREATININE 1.42* 1.39*  CALCIUM 8.4 9.3   Assessment / Plan: *80 y.o. male with GI bleeding, presumed diverticular:  Colonoscopy 8/26 showed recent blood in left colon with several diverticulum; no active bleeding at the time of colonoscopy. -Blood loss anemia:  Hgb down about 2 grams today.  No overt bleeding.  ? Equilibration.  Monitor Hgb.    LOS: 4 days   ZEHR, JESSICA D.  05/31/2014, 9:49 AM  Pager number [phone]   ________________________________________________________________________  Velora Heckler GI MD note:  I personally examined the patient, reviewed the data and agree with the assessment and plan described above.  Completed  BF and lunch (just now).  No overt bleeding in 24 hours.  He is safe to go home later today.  He understands that he may see small amount of blood with next 1-2 BMs but that he should call or return to ER if significant overt bleeding recurs.   Owens Loffler, MD [facility] Pager [phone]

## 2016-05-29 ENCOUNTER — Encounter (HOSPITAL_COMMUNITY): Payer: Self-pay

## 2016-05-29 ENCOUNTER — Observation Stay (HOSPITAL_COMMUNITY)
Admission: EM | Admit: 2016-05-29 | Discharge: 2016-05-30 | Disposition: A | Discharge status: Home / Self Care | Payer: Medicare Other | Attending: Internal Medicine | Admitting: Internal Medicine

## 2016-05-29 ENCOUNTER — Emergency Department (HOSPITAL_COMMUNITY): Payer: Medicare Other

## 2016-05-29 DIAGNOSIS — R0789 Other chest pain: Principal | ICD-10-CM | POA: Insufficient documentation

## 2016-05-29 DIAGNOSIS — I129 Hypertensive chronic kidney disease with stage 1 through stage 4 chronic kidney disease, or unspecified chronic kidney disease: Secondary | ICD-10-CM | POA: Insufficient documentation

## 2016-05-29 DIAGNOSIS — Z79899 Other long term (current) drug therapy: Secondary | ICD-10-CM | POA: Insufficient documentation

## 2016-05-29 DIAGNOSIS — R071 Chest pain on breathing: Secondary | ICD-10-CM | POA: Diagnosis not present

## 2016-05-29 DIAGNOSIS — R079 Chest pain, unspecified: Secondary | ICD-10-CM | POA: Diagnosis present

## 2016-05-29 DIAGNOSIS — N183 Chronic kidney disease, stage 3 unspecified: Secondary | ICD-10-CM | POA: Diagnosis present

## 2016-05-29 DIAGNOSIS — R072 Precordial pain: Secondary | ICD-10-CM | POA: Diagnosis not present

## 2016-05-29 DIAGNOSIS — I10 Essential (primary) hypertension: Secondary | ICD-10-CM | POA: Diagnosis present

## 2016-05-29 DIAGNOSIS — N400 Enlarged prostate without lower urinary tract symptoms: Secondary | ICD-10-CM | POA: Diagnosis present

## 2016-05-29 DIAGNOSIS — Z7982 Long term (current) use of aspirin: Secondary | ICD-10-CM | POA: Diagnosis not present

## 2016-05-29 HISTORY — DX: Ulcerative colitis, unspecified, without complications: K51.90

## 2016-05-29 HISTORY — DX: Personal history of other diseases of the digestive system: Z87.19

## 2016-05-29 HISTORY — DX: Unspecified osteoarthritis, unspecified site: M19.90

## 2016-05-29 HISTORY — DX: Chronic kidney disease, unspecified: N18.9

## 2016-05-29 LAB — BASIC METABOLIC PANEL
Anion gap: 9 (ref 5–15)
BUN: 13 mg/dL (ref 6–20)
CALCIUM: 9.1 mg/dL (ref 8.9–10.3)
CO2: 21 mmol/L — ABNORMAL LOW (ref 22–32)
CREATININE: 1.55 mg/dL — AB (ref 0.61–1.24)
Chloride: 111 mmol/L (ref 101–111)
GFR calc Af Amer: 43 mL/min — ABNORMAL LOW (ref >60)
GFR calc non Af Amer: 37 mL/min — ABNORMAL LOW (ref >60)
GLUCOSE: 133 mg/dL — AB (ref 65–99)
Potassium: 4.5 mmol/L (ref 3.5–5.1)
Sodium: 141 mmol/L (ref 135–145)

## 2016-05-29 LAB — CBC
HEMATOCRIT: 47.4 % (ref 39.0–52.0)
Hemoglobin: 15.5 g/dL (ref 13.0–17.0)
MCH: 31.7 pg (ref 26.0–34.0)
MCHC: 32.7 g/dL (ref 30.0–36.0)
MCV: 96.9 fL (ref 78.0–100.0)
Platelets: 216 10*3/uL (ref 150–400)
RBC: 4.89 MIL/uL (ref 4.22–5.81)
RDW: 13.7 % (ref 11.5–15.5)
WBC: 6.4 10*3/uL (ref 4.0–10.5)

## 2016-05-29 LAB — TROPONIN I
Troponin I: <0.03 ng/mL (ref <0.03)
Troponin I: <0.03 ng/mL (ref <0.03)
Troponin I: <0.03 ng/mL (ref <0.03)

## 2016-05-29 LAB — I-STAT TROPONIN, ED: TROPONIN I, POC: 0 ng/mL (ref 0.00–0.08)

## 2016-05-29 MED ORDER — HYDROCODONE-ACETAMINOPHEN 5-325 MG PO TABS: 1.0000 | ORAL_TABLET | ORAL | Status: DC | PRN

## 2016-05-29 MED ORDER — ONDANSETRON HCL 4 MG/2ML IJ SOLN: 4.0000 mg | Freq: Four times a day (QID) | INTRAMUSCULAR | Status: DC | PRN

## 2016-05-29 MED ORDER — TRAZODONE HCL 50 MG PO TABS: 25.0000 mg | ORAL_TABLET | Freq: Every evening | ORAL | Status: DC | PRN

## 2016-05-29 MED ORDER — TAMSULOSIN HCL 0.4 MG PO CAPS
0.4000 mg | ORAL_CAPSULE | Freq: Every day | ORAL | Status: DC
Start: 2016-05-29 — End: 2016-05-30
  Administered 2016-05-29: 0.4 mg via ORAL
  Filled 2016-05-29: qty 1

## 2016-05-29 MED ORDER — LATANOPROST 0.005 % OP SOLN
1.0000 [drp] | Freq: Every day | OPHTHALMIC | Status: DC
  Administered 2016-05-29: 1 [drp] via OPHTHALMIC
  Filled 2016-05-29: qty 2.5

## 2016-05-29 MED ORDER — ASPIRIN 81 MG PO CHEW
81.0000 mg | CHEWABLE_TABLET | Freq: Every day | ORAL | Status: DC
Start: 2016-05-30 — End: 2016-05-30

## 2016-05-29 MED ORDER — ONDANSETRON HCL 4 MG PO TABS: 4.0000 mg | ORAL_TABLET | Freq: Four times a day (QID) | ORAL | Status: DC | PRN

## 2016-05-29 MED ORDER — GI COCKTAIL ~~LOC~~: 30.0000 mL | Freq: Four times a day (QID) | ORAL | Status: DC | PRN

## 2016-05-29 MED ORDER — METOPROLOL TARTRATE 25 MG PO TABS
25.0000 mg | ORAL_TABLET | Freq: Every day | ORAL | Status: DC
  Administered 2016-05-29: 25 mg via ORAL
  Filled 2016-05-29: qty 1

## 2016-05-29 MED ORDER — ENOXAPARIN SODIUM 30 MG/0.3ML ~~LOC~~ SOLN
30.0000 mg | SUBCUTANEOUS | Status: DC
  Administered 2016-05-29: 30 mg via SUBCUTANEOUS
  Filled 2016-05-29: qty 0.3

## 2016-05-29 MED ORDER — BISACODYL 5 MG PO TBEC: 5.0000 mg | DELAYED_RELEASE_TABLET | Freq: Every day | ORAL | Status: DC | PRN

## 2016-05-29 MED ORDER — ASPIRIN 325 MG PO TABS
325.0000 mg | ORAL_TABLET | Freq: Once | ORAL | Status: AC
  Administered 2016-05-29: 325 mg via ORAL
  Filled 2016-05-29: qty 1

## 2016-05-29 MED ORDER — POLYETHYLENE GLYCOL 3350 17 G PO PACK: 17.0000 g | PACK | Freq: Every day | ORAL | Status: DC | PRN

## 2016-05-29 NOTE — ED Provider Notes (Signed)
[facility] DEPT Provider Note   CSN: [id number] Arrival date & time: 05/29/16  1008     History   Chief Complaint Chief Complaint  Patient presents with  . Chest Pain    HPI Knoxton Ansley is a 82 y.o. male.  HPI History is obtained from patient and from patient's grandson who accompanies him patient had episode of chest pain yesterday described as left anterior tightness, nonradiating. Episode lasted 15 minutes. His grandson reports that he's been having similar episodes of intermittent chest pain throughout the past week. Patient is presently asymptomatic no treatment prior to coming here Past Medical History:  Diagnosis Date  . Abdominal desmoid tumor   . Bronchitis   . Hypertension   . Prostate pain     Patient Active Problem List   Diagnosis Date Noted  . Diverticular hemorrhage 05/31/2014  . Orthostatic hypotension 05/29/2014  . Syncope and collapse 05/28/2014  . Acute post-hemorrhagic anemia 05/28/2014  . Essential hypertension, benign 05/28/2014  . Stage III chronic kidney disease 05/28/2014  . BRBPR (bright red blood per rectum) 05/27/2014  . CKD (chronic kidney disease) stage 3, GFR 30-59 ml/min 05/27/2014  . CAP (community acquired pneumonia) 02/13/2012  . Fever and chills 02/13/2012  . ARF (acute renal failure) ([place]) 02/13/2012  . BPH (benign prostatic hyperplasia) 02/13/2012  . HYPERTENSION, UNSPECIFIED 02/20/2010  . CHEST PAIN 02/20/2010    Past Surgical History:  Procedure Laterality Date  . COLONOSCOPY N/A 05/30/2014   Procedure: COLONOSCOPY;  Surgeon: Milus Banister, MD;  Location: [place];  Service: Endoscopy;  Laterality: N/A;  . TOOTH EXTRACTION         Home Medications    Prior to Admission medications   Medication Sig Start Date End Date Taking? Authorizing Provider  aspirin 81 MG chewable tablet Chew 1 tablet (81 mg total) by mouth at bedtime. Restart in 1 week 05/31/14  Yes Domenic Polite, MD  diclofenac (VOLTAREN) 50 MG EC  tablet Take 50 mg by mouth daily.   Yes Historical Provider, MD  latanoprost (XALATAN) 0.005 % ophthalmic solution Place 1 drop into both eyes at bedtime.   Yes Historical Provider, MD  metoprolol tartrate (LOPRESSOR) 25 MG tablet Take 25 mg by mouth at bedtime.    Yes Historical Provider, MD  Multiple Vitamin (MULITIVITAMIN WITH MINERALS) TABS Take 1 tablet by mouth at bedtime.    Yes Historical Provider, MD  Tamsulosin HCl (FLOMAX) 0.4 MG CAPS Take 0.4 mg by mouth at bedtime.   Yes Historical Provider, MD    Family History No family history on file.  Social History Social History  Substance Use Topics  . Smoking status: Never Smoker  . Smokeless tobacco: Never Used  . Alcohol use Yes     Comment: Occasional     Allergies   Review of patient's allergies indicates no known allergies.   Review of Systems Review of Systems  Constitutional: Negative.   HENT: Negative.   Respiratory: Negative.   Cardiovascular: Positive for chest pain.  Gastrointestinal: Negative.   Musculoskeletal: Negative.   Skin: Negative.   Allergic/Immunologic: Negative.   Neurological: Negative.   Psychiatric/Behavioral: Negative.   All other systems reviewed and are negative.    Physical Exam Updated Vital Signs BP 120/81 (BP Location: Right Arm)   Pulse 74   Temp 97.7 F (36.5 C) (Oral)   Resp 18   Ht 5\' 6"  (1.676 m)   Wt 160 lb (72.6 kg)   SpO2 99%   BMI 25.82 kg/m  Physical Exam  Constitutional: He appears well-developed and well-nourished.  HENT:  Head: Normocephalic and atraumatic.  Eyes: Conjunctivae are normal. Pupils are equal, round, and reactive to light.  Neck: Neck supple. No tracheal deviation present. No thyromegaly present.  Cardiovascular: Normal rate and regular rhythm.   No murmur heard. Pulmonary/Chest: Effort normal and breath sounds normal.  Abdominal: Soft. Bowel sounds are normal. He exhibits no distension. There is no tenderness.  Musculoskeletal: Normal  range of motion. He exhibits no edema or tenderness.  Neurological: He is alert. Coordination normal.  Skin: Skin is warm and dry. No rash noted.  Psychiatric: He has a normal mood and affect.  Nursing note and vitals reviewed.    ED Treatments / Results  Labs (all labs ordered are listed, but only abnormal results are displayed) Labs Reviewed  BASIC METABOLIC PANEL - Abnormal; Notable for the following:       Result Value   CO2 21 (*)    Glucose, Bld 133 (*)    Creatinine, Ser 1.55 (*)    GFR calc non Af Amer 37 (*)    GFR calc Af Amer 43 (*)    All other components within normal limits  CBC  I-STAT TROPOININ, ED    EKG  EKG Interpretation  Date/Time:  Friday May 29 2016 10:15:06 EDT Ventricular Rate:  80 PR Interval:  180 QRS Duration: 80 QT Interval:  402 QTC Calculation: 463 R Axis:   19 Text Interpretation:  Normal sinus rhythm Nonspecific ST and T wave abnormality Abnormal ECG Since last tracing rate slower Confirmed by Winfred Leeds  MD, Derel Mcglasson [phone]) on 05/29/2016 11:56:50 AM       Radiology Dg Chest 2 View  Result Date: 05/29/2016 CLINICAL DATA:  Chest pain and hypertension EXAM: CHEST  2 VIEW COMPARISON:  06/29/2012 FINDINGS: Cardiac shadow is stable. Aortic calcifications and tortuosity are again seen. The lungs are well aerated bilaterally. No focal infiltrate or sizable effusion is seen. Mild scarring is noted. No bony abnormality is seen. IMPRESSION: No acute abnormality noted. Electronically Signed   By: Inez Catalina M.D.   On: 05/29/2016 11:07    Procedures Procedures (including critical care time)  Medications Ordered in ED Medications - No data to display Results for orders placed or performed during the hospital encounter of [id number]  Basic metabolic panel  Result Value Ref Range   Sodium 141 135 - 145 mmol/L   Potassium 4.5 3.5 - 5.1 mmol/L   Chloride 111 101 - 111 mmol/L   CO2 21 (L) 22 - 32 mmol/L   Glucose, Bld 133 (H) 65 - 99 mg/dL   BUN  13 6 - 20 mg/dL   Creatinine, Ser 1.55 (H) 0.61 - 1.24 mg/dL   Calcium 9.1 8.9 - 10.3 mg/dL   GFR calc non Af Amer 37 (L) >60 mL/min   GFR calc Af Amer 43 (L) >60 mL/min   Anion gap 9 5 - 15  CBC  Result Value Ref Range   WBC 6.4 4.0 - 10.5 K/uL   RBC 4.89 4.22 - 5.81 MIL/uL   Hemoglobin 15.5 13.0 - 17.0 g/dL   HCT 47.4 39.0 - 52.0 %   MCV 96.9 78.0 - 100.0 fL   MCH 31.7 26.0 - 34.0 pg   MCHC 32.7 30.0 - 36.0 g/dL   RDW 13.7 11.5 - 15.5 %   Platelets 216 150 - 400 K/uL  I-stat troponin, ED  Result Value Ref Range   Troponin i, poc 0.00 0.00 -  0.08 ng/mL   Comment 3           Dg Chest 2 View  Result Date: 05/29/2016 CLINICAL DATA:  Chest pain and hypertension EXAM: CHEST  2 VIEW COMPARISON:  06/29/2012 FINDINGS: Cardiac shadow is stable. Aortic calcifications and tortuosity are again seen. The lungs are well aerated bilaterally. No focal infiltrate or sizable effusion is seen. Mild scarring is noted. No bony abnormality is seen. IMPRESSION: No acute abnormality noted. Electronically Signed   By: Inez Catalina M.D.   On: 05/29/2016 11:07    Initial Impression / Assessment and Plan / ED Course  I have reviewed the triage vital signs and the nursing notes.  Pertinent labs & imaging results that were available during my care of the patient were reviewed by me and considered in my medical decision making (see chart for details).  Clinical Course  Heart score equals 4 based on age, risk factors, EKG criteria  Chest x-ray viewed by me. Hospitalist service consulted. Spoke with Loma Sousa, NP who will evaluate patient in ED and arrange for overnight stay . Renal insufficiency is chronic Final Clinical Impressions(s) / ED Diagnoses  Diagnoses #1 chest pain at rest #2 chronic renal insufficiency Final diagnoses:  None    New Prescriptions New Prescriptions   No medications on file     Orlie Dakin, MD 05/29/16 1256

## 2016-05-29 NOTE — Consult Note (Signed)
Cardiology Consult    Patient ID: Arvine Rodeman MRN: [id number], DOB/AGE: October 27, 1921   Admit date: 05/29/2016 Date of Consult: 05/29/2016  Primary Physician: Foye Spurling, MD Reason for Consult: Chest Pain Primary Cardiologist: Dr. Aundra Dubin Requesting Provider: Dr. Allyson Sabal  History of Present Illness    Juane Mossey is a 82 y.o. male with past medical history of HTN and Stage 3 CKD who presented to [facility] ED on 05/29/2016 for evaluation of chest pain.  He reports working with a zipper on a tire-inflator gauge yesterday and became frustrated because he could not get it to unzip. At that time, he developed a mild pressure in his chest. Reports the pain lasted 10-15 minutes with no associated symptoms. He did not think anything of it but informed his daughter later that evening who called his PCP this morning and they recommended he come to the ED for evaluation.   He denies any previous episodes of chest discomfort but his niece at the bedside says she had heard from other family members he was grabbing his chest multiple times this week. He denies this and says he has felt fine. Denies any recent illnesses, nausea, vomiting, fever, or chills.   He is active at baseline for his age, helping take care of his blind, bed-ridden wife. They live by themselves but have family who come and help with her throughout the day. He ambulates to the mailbox daily which he says is a few hundred yards away and still drives. Goes to [employer] store and denies any chest discomfort or dyspnea with exertion with these activities.   Denies any history of heart attacks. Says he was last seen here for chest pain many years ago and by review of records he had an echocardiogram in 2011 which showed normal LV function of 55-60% with no wall motion abnormalities.  Has HTN and takes Lopressor 25mg  daily for this. Denies any history of HLD or Type 2 DM. He denies any tobacco use.   While in the ED,  labs showed a WBC of 6.4, Hgb 15.5, and platelets 216. Electrolytes within normal limits. Creatinine 1.55 (was 1.39 two years ago). Initial troponin negative. EKG shows NSR, HR 80, with no acute ST or T-wave changes since previous tracing.  CXR with no acute abnormalities.   He denies any repeat episodes of chest pain since being admitted.  Past Medical History   Past Medical History:  Diagnosis Date  . Abdominal desmoid tumor   . Bronchitis   . Hypertension   . Prostate pain     Past Surgical History:  Procedure Laterality Date  . COLONOSCOPY N/A 05/30/2014   Procedure: COLONOSCOPY;  Surgeon: Milus Banister, MD;  Location: [place];  Service: Endoscopy;  Laterality: N/A;  . TOOTH EXTRACTION       Allergies  No Known Allergies  Inpatient Medications    . [START ON 05/30/2016] aspirin  81 mg Oral QHS  . enoxaparin (LOVENOX) injection  30 mg Subcutaneous Q24H  . latanoprost  1 drop Both Eyes QHS  . metoprolol tartrate  25 mg Oral QHS  . tamsulosin  0.4 mg Oral QHS    Family History    Family History  Problem Relation Age of Onset  . Heart disease Mother   . Diabetes Mother     Social History    Social History   Social History  . Marital status: Married    Spouse name: N/A  . Number of children:  N/A  . Years of education: N/A   Occupational History  . Not on file.   Social History Main Topics  . Smoking status: Never Smoker  . Smokeless tobacco: Never Used  . Alcohol use Yes     Comment: Occasional  . Drug use: No  . Sexual activity: No   Other Topics Concern  . Not on file   Social History Narrative  . No narrative on file     Review of Systems    General:  No chills, fever, night sweats or weight changes.  Cardiovascular:  No  dyspnea on exertion, edema, orthopnea, palpitations, paroxysmal nocturnal dyspnea. Positive for chest pain.  Dermatological: No rash, lesions/masses Respiratory: No cough, dyspnea Urologic: No hematuria,  dysuria Abdominal:   No nausea, vomiting, diarrhea, bright red blood per rectum, melena, or hematemesis Neurologic:  No visual changes, wkns, changes in mental status. All other systems reviewed and are otherwise negative except as noted above.  Physical Exam    Blood pressure (!) 152/82, pulse 86, temperature 97.7 F (36.5 C), temperature source Oral, resp. rate 18, height 5\' 6"  (1.676 m), weight 160 lb (72.6 kg), SpO2 100 %.  General: Pleasant, African American male appearing in NAD. Appears younger than stated age.  Psych: Normal affect. Neuro: Alert and oriented X 3. Moves all extremities spontaneously. HEENT: Normal  Neck: Supple without bruits or JVD. Lungs:  Resp regular and unlabored, CTA without wheezing or rales. Heart: RRR no s3, s4, or murmurs. Abdomen: Soft, non-tender, non-distended, BS + x 4.  Extremities: No clubbing, cyanosis or edema. DP/PT/Radials 2+ and equal bilaterally.  Labs    Troponin [facility] of Care Test)  Recent Labs  05/29/16 1046  TROPIPOC 0.00   No results for input(s): CKTOTAL, CKMB, TROPONINI in the last 72 hours. Lab Results  Component Value Date   WBC 6.4 05/29/2016   HGB 15.5 05/29/2016   HCT 47.4 05/29/2016   MCV 96.9 05/29/2016   PLT 216 05/29/2016     Recent Labs Lab 05/29/16 1038  NA 141  K 4.5  CL 111  CO2 21*  BUN 13  CREATININE 1.55*  CALCIUM 9.1  GLUCOSE 133*   Radiology Studies    Dg Chest 2 View  Result Date: 05/29/2016 CLINICAL DATA:  Chest pain and hypertension EXAM: CHEST  2 VIEW COMPARISON:  06/29/2012 FINDINGS: Cardiac shadow is stable. Aortic calcifications and tortuosity are again seen. The lungs are well aerated bilaterally. No focal infiltrate or sizable effusion is seen. Mild scarring is noted. No bony abnormality is seen. IMPRESSION: No acute abnormality noted. Electronically Signed   By: Inez Catalina M.D.   On: 05/29/2016 11:07    EKG & Cardiac Imaging    EKG:  NSR, HR 80, with no acute ST or T-wave  changes since previous tracing  Echocardiogram: 09/2010  Assessment & Plan    1. Chest Pain - reports one episode of chest discomfort yesterday afternoon while working with a zipper. Lasted 10-15 minutes and no repeat episodes since. Family members report he had been grabbing his chest earlier this week but he denies this. Denies any current chest discomfort. - for his age, he is very active, walking over 200 yards daily to the mailbox and still driving and traveling to the grocery store. Denies any exertional symptoms with activity.  - last echocardiogram in 2011 showed normal LV function of 55-60% with no wall motion abnormalities. Denies any history of cardiac catheterizations or prior MI's. No history of this  in our records.  - initial troponin is negative and EKG shows NSR, HR 80, with no acute ST or T-wave changes. - continue to cycle troponin values. If they remain negative, he does not have repeat episodes of CP, and echocardiogram does not show any significant abnormalities, would not pursue further ischemic evaluation at this time. Patient favors a conservative approach, as he wants to go home as soon as possible and with his advanced age this approach certainly seems reasonable.   2. HTN - BP has been 120/81 - 177/86 while admitted.  - on Metoprolol Tartrate 25mg  once daily PTA. May benefit from switch to Toprol-XL 25mg  daily or going to BID dosing.   3. Stage 3 CKD - Creatinine 1.55 on admission, was 1.39 two years ago. - repeat BMET tomorrow morning.   Signed, Erma Heritage, PA-C 05/29/2016, 3:43 PM Pager: [phone]  As above, patient seen and examined. Briefly he is a 82 year old male with past medical history of hypertension for evaluation of chest pain.  He is fairly active and denies exertional chest pain, dyspnea on exertion, orthopnea,, PND or pedal edema. 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 When he went back inside he  developed chest pressure in the substernal area without radiation. Not pleuritic, positional or related to food. No associated nausea or diaphoresis; mild dyspnea. Lasted 10 minutes and resolved. His primary care doctor was called this morning and he was sent to the emergency room. He has had no symptoms since. Electrocardiogram shows sinus rhythm with minor nonspecific ST changes.  Initial enzymes negative.  Creatinine 1.55.  Chest pain with both typical and atypical features. Long discussion with patient and his niece today. He is 33 years old. I discussed proceeding with a stress test and if abnormal cardiac catheterization versus observation for recurrent symptoms. Like to be conservative if possible. He declines stress test at this point and I have explained the risk of undiagnosed coronary disease including myocardial infarction and death. He potentially would be agreeable to further evaluation if he has recurrent symptoms. Cycle enzymes. If negative he can be discharged and follow-up with me in the office. If abnormal he would need to consider cardiac catheterization. Would continue aspirin and metoprolol at discharge.  Kirk Ruths, MD

## 2016-05-29 NOTE — ED Triage Notes (Signed)
Per PT, Pt is coming from home with complaints of central chest pain that took place yesterday while working on a broke zipper. Pt denies any N/V or SOB. Pt reports no pain today.

## 2016-05-29 NOTE — Progress Notes (Signed)
Admission nurse notified of new pt.

## 2016-05-29 NOTE — H&P (Signed)
History and Physical    Jerry Berry [id number] DOB: [birth year] DOA: 05/29/2016  PCP: Foye Spurling, MD  Patient coming from:   Home    Chief Complaint: chest tightness  HPI: Jerry Berry is a 82 y.o. male with a medical history significant for HTN and CKD. He presented to ED for evaluation of chest tightness. The episode occurred yesterday after patient became frustrated while trying to fix a zipper on air compressor cover. The chest tightness was mid to left chest. If did not radiate, lasted about 15 minutes. No associated shortness of breath, nausea or diaphoresis. No other episodes of chest pain wince 2011 per patient thought Grandson implied that this is patient's 3rd episode of similar discomfort. No coughing.   ED Course:  BP slightly elevated, vitals otherwise stable.  Full ASA given.   Review of Systems: As per HPI, otherwise 10 point review of systems negative.   Past Medical History:  Diagnosis Date  . Abdominal desmoid tumor   . Bronchitis   . Hypertension   . Prostate pain     Past Surgical History:  Procedure Laterality Date  . COLONOSCOPY N/A 05/30/2014   Procedure: COLONOSCOPY;  Surgeon: Milus Banister, MD;  Location: [place];  Service: Endoscopy;  Laterality: N/A;  . TOOTH EXTRACTION      Social History   Social History  . Marital status: Married    Spouse name: N/A  . Number of children: N/A  . Years of education: N/A   Occupational History  . Not on file.   Social History Main Topics  . Smoking status: Never Smoker  . Smokeless tobacco: Never Used  . Alcohol use Yes     Comment: Occasional  . Drug use: No  . Sexual activity: No   Other Topics Concern  . Not on file   Social History Narrative  . No narrative on file  lives at home with wife who is blind and has health problems. Patient is wife's primary caretaker. No assistive devices needed for ambulation  No Known Allergies  Family History  Problem Relation Age of Onset    . Heart disease Mother     Prior to Admission medications   Medication Sig Start Date End Date Taking? Authorizing Provider  aspirin 81 MG chewable tablet Chew 1 tablet (81 mg total) by mouth at bedtime. Restart in 1 week 05/31/14  Yes Domenic Polite, MD  diclofenac (VOLTAREN) 50 MG EC tablet Take 50 mg by mouth daily.   Yes Historical Provider, MD  latanoprost (XALATAN) 0.005 % ophthalmic solution Place 1 drop into both eyes at bedtime.   Yes Historical Provider, MD  metoprolol tartrate (LOPRESSOR) 25 MG tablet Take 25 mg by mouth at bedtime.    Yes Historical Provider, MD  Multiple Vitamin (MULITIVITAMIN WITH MINERALS) TABS Take 1 tablet by mouth at bedtime.    Yes Historical Provider, MD  Tamsulosin HCl (FLOMAX) 0.4 MG CAPS Take 0.4 mg by mouth at bedtime.   Yes Historical Provider, MD    Physical Exam: Vitals:   05/29/16 1018 05/29/16 1025  BP: 120/81   Pulse: 74   Resp: 18   Temp: 97.7 F (36.5 C)   TempSrc: Oral   SpO2: 99%   Weight:  72.6 kg (160 lb)  Height:  5\' 6"  (1.676 m)    Constitutional:  Pleasant well developed black male in NAD, calm, comfortable Vitals:   05/29/16 1018 05/29/16 1025  BP: 120/81   Pulse: 74   Resp: 18  Temp: 97.7 F (36.5 C)   TempSrc: Oral   SpO2: 99%   Weight:  72.6 kg (160 lb)  Height:  5\' 6"  (1.676 m)   Eyes: PER, lids and conjunctivae normal ENMT: Mucous membranes are moist. Posterior pharynx clear of any exudate or lesions..  Neck: normal, supple, no masses Respiratory: clear to auscultation bilaterally, no wheezing, no crackles. Normal respiratory effort. No accessory muscle use.  Cardiovascular: Regular rate and rhythm, no murmurs / rubs / gallops. No extremity edema. 2+ dorsal pedis pulses.   Abdomen: no tenderness, no masses palpated. No hepatomegaly. Bowel sounds positive.  Musculoskeletal: no clubbing / cyanosis. No joint deformity upper and lower extremities. Good ROM, no contractures. Normal muscle tone.  Skin: no rashes,  lesions, ulcers.  Neurologic: CN 2-12 grossly intact. Sensation intact, Strength 5/5 in all 4.  Psychiatric: Normal judgment and insight. Alert and oriented x 3. Normal mood.    Labs on Admission: I have personally reviewed following labs and imaging studies  Radiological Exams on Admission: Dg Chest 2 View  Result Date: 05/29/2016 CLINICAL DATA:  Chest pain and hypertension EXAM: CHEST  2 VIEW COMPARISON:  06/29/2012 FINDINGS: Cardiac shadow is stable. Aortic calcifications and tortuosity are again seen. The lungs are well aerated bilaterally. No focal infiltrate or sizable effusion is seen. Mild scarring is noted. No bony abnormality is seen. IMPRESSION: No acute abnormality noted. Electronically Signed   By: Inez Catalina M.D.   On: 05/29/2016 11:07    EKG: Independently reviewed.   EKG Interpretation  Date/Time:  Friday May 29 2016 10:15:06 EDT Ventricular Rate:  80 PR Interval:  180 QRS Duration: 80 QT Interval:  402 QTC Calculation: 463 R Axis:   19 Text Interpretation:  Normal sinus rhythm Nonspecific ST and T wave abnormality Abnormal ECG Since last tracing rate slower Confirmed by Winfred Leeds  MD, SAM [phone]) on 05/29/2016 11:56:50 AM       Assessment/Plan   Active Problems:   BPH (benign prostatic hyperplasia)   CKD (chronic kidney disease) stage 3, GFR 30-59 ml/min   Essential hypertension, benign   Chest pain      Chest pain . Heart score 5.   List of differential diagnoses include ACS, GERD, MSK pain.   -place in OBS-tele -Chest pain order set utilized -Consult placed to Cardiology -follow up on troponins, lipid panel and A1c -Daily aspirin -A1c & lipid panel -ASA 325mg  given in ED, resume home daily baby asa tomorrow -repeat EKG in am  Hypertension. .BP mildly elevated at 140/83.  -Continue home metoprolol -Add prn hydralazine  BPH. -continue home flomax  DVT prophylaxis:   Lovenox   Code Status:     Full code   Family Communication:    Treatment  plan discussed with grandson in the room and he understands and agrees with the plan..  Disposition Plan:   Discharge  24-48 hours hours              Consults called:  Cardiology  Admission status:   Observation -Telemetry     Tye Savoy NP [facility] Pager [phone]  If 7PM-7AM, please contact night-coverage [email or web address] Password [facility]  05/29/2016, 12:43 PM

## 2016-05-29 NOTE — ED Notes (Addendum)
Patient comes in today after an episode yesterday of mild tightness in the chest as he was fixing a zipper. Patient states the tightness lasted for about 15 mins. Patient denies any tightness in the chest right now. Denies having shortness of breath. No known cardiac hx. Patient appears confused regarding the series of events in the hospital. Family states this is his baseline orientation. Denies n/v.

## 2016-05-29 NOTE — Progress Notes (Signed)
Patient transferred, oriented to unit, niece at bedside and call bell within reach.

## 2016-05-30 ENCOUNTER — Observation Stay (HOSPITAL_COMMUNITY): Payer: Medicare Other

## 2016-05-30 DIAGNOSIS — I10 Essential (primary) hypertension: Secondary | ICD-10-CM

## 2016-05-30 DIAGNOSIS — N183 Chronic kidney disease, stage 3 (moderate): Secondary | ICD-10-CM

## 2016-05-30 DIAGNOSIS — R079 Chest pain, unspecified: Secondary | ICD-10-CM

## 2016-05-30 DIAGNOSIS — N400 Enlarged prostate without lower urinary tract symptoms: Secondary | ICD-10-CM

## 2016-05-30 LAB — HEMOGLOBIN A1C
HEMOGLOBIN A1C: 6 % — AB (ref 4.8–5.6)
Mean Plasma Glucose: 126 mg/dL

## 2016-05-30 LAB — ECHOCARDIOGRAM COMPLETE
CHL CUP MV DEC (S): 296
CHL CUP RV SYS PRESS: 26 mmHg
CHL CUP TV REG PEAK VELOCITY: 242 cm/s
E decel time: 296 msec
E/e' ratio: 14.32
FS: 23 % — AB (ref 28–44)
Height: 66 in
IV/PV OW: 1.01
LA vol A4C: 20.9 ml
LADIAMINDEX: 1.54 cm/m2
LASIZE: 28 mm
LAVOL: 27.6 mL
LAVOLIN: 15.2 mL/m2
LEFT ATRIUM END SYS DIAM: 28 mm
LV E/e' medial: 14.32
LV E/e'average: 14.32
LV PW d: 10.2 mm — AB (ref 0.6–1.1)
LV TDI E'MEDIAL: 8.27
LVELAT: 5.55 cm/s
LVOT area: 4.91 cm2
LVOTD: 25 mm
MV pk E vel: 79.5 m/s
MVPG: 3 mmHg
MVPKAVEL: 122 m/s
P 1/2 time: 519 ms
RV LATERAL S' VELOCITY: 13.6 cm/s
TAPSE: 17.9 mm
TDI e' lateral: 5.55
TRMAXVEL: 242 cm/s
Weight: 2560 oz

## 2016-05-30 LAB — BASIC METABOLIC PANEL
Anion gap: 8 (ref 5–15)
BUN: 12 mg/dL (ref 6–20)
CO2: 25 mmol/L (ref 22–32)
CREATININE: 1.28 mg/dL — AB (ref 0.61–1.24)
Calcium: 9.1 mg/dL (ref 8.9–10.3)
Chloride: 109 mmol/L (ref 101–111)
GFR calc Af Amer: 54 mL/min — ABNORMAL LOW (ref >60)
GFR, EST NON AFRICAN AMERICAN: 47 mL/min — AB (ref >60)
GLUCOSE: 103 mg/dL — AB (ref 65–99)
POTASSIUM: 4 mmol/L (ref 3.5–5.1)
SODIUM: 142 mmol/L (ref 135–145)

## 2016-05-30 LAB — LIPID PANEL
Cholesterol: 174 mg/dL (ref 0–200)
HDL: 36 mg/dL — AB (ref >40)
LDL CALC: 101 mg/dL — AB (ref 0–99)
TRIGLYCERIDES: 186 mg/dL — AB (ref <150)
Total CHOL/HDL Ratio: 4.8 RATIO
VLDL: 37 mg/dL (ref 0–40)

## 2016-05-30 LAB — CBC
HEMATOCRIT: 45.8 % (ref 39.0–52.0)
Hemoglobin: 15 g/dL (ref 13.0–17.0)
MCH: 31.6 pg (ref 26.0–34.0)
MCHC: 32.8 g/dL (ref 30.0–36.0)
MCV: 96.4 fL (ref 78.0–100.0)
PLATELETS: 224 10*3/uL (ref 150–400)
RBC: 4.75 MIL/uL (ref 4.22–5.81)
RDW: 13.5 % (ref 11.5–15.5)
WBC: 6.5 10*3/uL (ref 4.0–10.5)

## 2016-05-30 MED ORDER — ENOXAPARIN SODIUM 40 MG/0.4ML ~~LOC~~ SOLN: 40.0000 mg | SUBCUTANEOUS | Status: DC

## 2016-05-30 NOTE — Progress Notes (Signed)
Patient is ready for discharge. Family at bedside. Patient and family educated about hospital stay and discharge instructions. IV and telemetry box removed.

## 2016-05-30 NOTE — Discharge Summary (Signed)
Physician Discharge Summary  Abdulraheem Shave [id number] DOB: Jul 14, 1922 DOA: 05/29/2016  PCP: Foye Spurling, MD  Admit date: 05/29/2016 Discharge date: 05/30/2016  Time spent: 65 minutes  Recommendations for Outpatient Follow-up:  1. Follow-up with Dr. Stanford Breed, cardiology. Office will call with outpatient appointment time. Final report on 2-D echo will need to be followed up upon. 2. Follow-up with Foye Spurling, MD in 2 weeks. On follow-up patient will need a basic metabolic profile to follow-up on electrolytes and renal function.    Discharge Diagnoses:  Principal Problem:   Chest pain Active Problems:   BPH (benign prostatic hyperplasia)   CKD (chronic kidney disease) stage 3, GFR 30-59 ml/min   Essential hypertension, benign   Discharge Condition: Stable and improved  Diet recommendation: Regular  Filed Weights   05/29/16 1025  Weight: 72.6 kg (160 lb)    History of present illness:  Per Dr. Jabier Gauss Slovick is a 82 y.o. male with a medical history significant for HTN and CKD. He presented to ED for evaluation of chest tightness. The episode occurred yesterday after patient became frustrated while trying to fix a zipper on air compressor cover. The chest tightness was mid to left chest. It did not radiate, lasted about 15 minutes. No associated shortness of breath, nausea or diaphoresis. No other episodes of chest pain wince 2011 per patient thought Grandson implied that this is patient's 3rd episode of similar discomfort. No coughing.   ED Course:  BP slightly elevated, vitals otherwise stable.  Full ASA given.   Hospital Course:  #1 chest pain Patient had presented with chest pain described as a tightness which occurred while working on a time later when he became frustrated. Patient denied any shortness of breath. Patient's chest pain lasted about 10 minutes and resolved. Patient called his PCP will direct it into the ED. EKG showed normal sinus rhythm with  nonspecific ST-T wave changes. Cardiology was consulted and followed the patient throughout the hospitalization. Patient was admitted to the hospital cardiac enzymes were cycled which are negative 3. 2-D echo was obtained and preliminary results per cardiology was a normal left ventricular function with evidence of diastolic dysfunction. Fasting lipid panel had a total cholesterol of 174 HDL of 36 LDL of 101 and a triglycerides of 186. Patient was maintained on aspirin, home dose beta blocker. Discussion was had between patient and cardiology who had recommended a stress test and if this was abnormal likely cardiac catheterization versus observation for recurrent symptoms. Patient wanted to be conservative at this time and stated that he would be agreeable for further evaluation if he had recurrent symptoms. Patient did not have any further chest pain throughout the rest of the hospitalization. Patient will be discharged home in stable and improved condition and will follow-up with Dr. Stanford Breed of cardiology as outpatient for further evaluation and management. Patient will also follow-up with PCP as outpatient.  #2 BPH Patient was continued on home regimen of Flomax.  #3 hypertension Patient was maintained on home regimen of metoprolol.  #4 chronic kidney disease stage III Remained stable. On admission patient's creatinine was 1.55. On day of discharge patient's creatinine was down to 1.28.  Procedures:  2-D echo 05/30/2016  Chest x-ray 05/29/2016  Consultations:  Cardiology: Dr. Stanford Breed 05/29/2016  Discharge Exam: Vitals:   05/29/16 2038 05/30/16 0411  BP: 138/80 109/65  Pulse: 73 72  Resp: 18 18  Temp: 98.1 F (36.7 C) 98 F (36.7 C)    General: NAD Cardiovascular: RRR Respiratory:  CTAB  Discharge Instructions   Discharge Instructions    Diet general    Complete by:  As directed   Increase activity slowly    Complete by:  As directed     Current Discharge Medication  List    CONTINUE these medications which have NOT CHANGED   Details  aspirin 81 MG chewable tablet Chew 1 tablet (81 mg total) by mouth at bedtime. Restart in 1 week    diclofenac (VOLTAREN) 50 MG EC tablet Take 50 mg by mouth daily.    latanoprost (XALATAN) 0.005 % ophthalmic solution Place 1 drop into both eyes at bedtime.    metoprolol tartrate (LOPRESSOR) 25 MG tablet Take 25 mg by mouth at bedtime.     Multiple Vitamin (MULITIVITAMIN WITH MINERALS) TABS Take 1 tablet by mouth at bedtime.     Tamsulosin HCl (FLOMAX) 0.4 MG CAPS Take 0.4 mg by mouth at bedtime.       No Known Allergies Follow-up Information    Foye Spurling, MD. Schedule an appointment as soon as possible for a visit in 2 week(s).   Specialty:  Internal Medicine Contact information: [address] [place] [address] [phone]        Kirk Ruths, MD .   Specialty:  Cardiology Why:  OFFICE WILL CALL WITH APPOINTMENT TIME FOR FOLLOW-UP. Contact information: [address] [place] [address] [phone]            The results of significant diagnostics from this hospitalization (including imaging, microbiology, ancillary and laboratory) are listed below for reference.    Significant Diagnostic Studies: Dg Chest 2 View  Result Date: 05/29/2016 CLINICAL DATA:  Chest pain and hypertension EXAM: CHEST  2 VIEW COMPARISON:  06/29/2012 FINDINGS: Cardiac shadow is stable. Aortic calcifications and tortuosity are again seen. The lungs are well aerated bilaterally. No focal infiltrate or sizable effusion is seen. Mild scarring is noted. No bony abnormality is seen. IMPRESSION: No acute abnormality noted. Electronically Signed   By: Inez Catalina M.D.   On: 05/29/2016 11:07    Microbiology: No results found for this or any previous visit (from the past 240 hour(s)).   Labs: Basic Metabolic Panel:  Recent Labs Lab 05/29/16 1038 05/30/16 0411  NA 141 142  K  4.5 4.0  CL 111 109  CO2 21* 25  GLUCOSE 133* 103*  BUN 13 12  CREATININE 1.55* 1.28*  CALCIUM 9.1 9.1   Liver Function Tests: No results for input(s): AST, ALT, ALKPHOS, BILITOT, PROT, ALBUMIN in the last 168 hours. No results for input(s): LIPASE, AMYLASE in the last 168 hours. No results for input(s): AMMONIA in the last 168 hours. CBC:  Recent Labs Lab 05/29/16 1038 05/30/16 0411  WBC 6.4 6.5  HGB 15.5 15.0  HCT 47.4 45.8  MCV 96.9 96.4  PLT 216 224   Cardiac Enzymes:  Recent Labs Lab 05/29/16 1444 05/29/16 1802 05/29/16 1936  TROPONINI <0.03 <0.03 <0.03   BNP: BNP (last 3 results) No results for input(s): BNP in the last 8760 hours.  ProBNP (last 3 results) No results for input(s): PROBNP in the last 8760 hours.  CBG: No results for input(s): GLUCAP in the last 168 hours.     SignedIrine Seal MD.  [facility] 05/30/2016, 11:48 AM

## 2016-05-30 NOTE — Progress Notes (Signed)
SUBJECTIVE:  No further chest pain.  No SOB.    PHYSICAL EXAM Vitals:   05/29/16 1422 05/29/16 1426 05/29/16 2038 05/30/16 0411  BP: 152/82 (!) 152/82 138/80 109/65  Pulse: 77 86 73 72  Resp:  18 18 18   Temp:   98.1 F (36.7 C) 98 F (36.7 C)  TempSrc:   Oral Oral  SpO2: 95% 100% 99% 98%  Weight:      Height:       General:  No distress Lungs:  Clear Heart:  RRR Abdomen:  Positive bowel sounds, no rebound no guarding Extremities:  No edema  LABS: Lab Results  Component Value Date   TROPONINI <0.03 05/29/2016   Results for orders placed or performed during the hospital encounter of 05/29/16 (from the past 24 hour(s))  Basic metabolic panel     Status: Abnormal   Collection Time: 05/29/16 10:38 AM  Result Value Ref Range   Sodium 141 135 - 145 mmol/L   Potassium 4.5 3.5 - 5.1 mmol/L   Chloride 111 101 - 111 mmol/L   CO2 21 (L) 22 - 32 mmol/L   Glucose, Bld 133 (H) 65 - 99 mg/dL   BUN 13 6 - 20 mg/dL   Creatinine, Ser 1.55 (H) 0.61 - 1.24 mg/dL   Calcium 9.1 8.9 - 10.3 mg/dL   GFR calc non Af Amer 37 (L) >60 mL/min   GFR calc Af Amer 43 (L) >60 mL/min   Anion gap 9 5 - 15  CBC     Status: None   Collection Time: 05/29/16 10:38 AM  Result Value Ref Range   WBC 6.4 4.0 - 10.5 K/uL   RBC 4.89 4.22 - 5.81 MIL/uL   Hemoglobin 15.5 13.0 - 17.0 g/dL   HCT 47.4 39.0 - 52.0 %   MCV 96.9 78.0 - 100.0 fL   MCH 31.7 26.0 - 34.0 pg   MCHC 32.7 30.0 - 36.0 g/dL   RDW 13.7 11.5 - 15.5 %   Platelets 216 150 - 400 K/uL  I-stat troponin, ED     Status: None   Collection Time: 05/29/16 10:46 AM  Result Value Ref Range   Troponin i, poc 0.00 0.00 - 0.08 ng/mL   Comment 3          Troponin I-serum (0, 3, 6 hours)     Status: None   Collection Time: 05/29/16  2:44 PM  Result Value Ref Range   Troponin I <0.03 <0.03 ng/mL  Hemoglobin A1c     Status: Abnormal   Collection Time: 05/29/16  2:44 PM  Result Value Ref Range   Hgb A1c MFr Bld 6.0 (H) 4.8 - 5.6 %   Mean Plasma  Glucose 126 mg/dL  Troponin I-serum (0, 3, 6 hours)     Status: None   Collection Time: 05/29/16  6:02 PM  Result Value Ref Range   Troponin I <0.03 <0.03 ng/mL  Troponin I-serum (0, 3, 6 hours)     Status: None   Collection Time: 05/29/16  7:36 PM  Result Value Ref Range   Troponin I <0.03 <0.03 ng/mL  Basic metabolic panel     Status: Abnormal   Collection Time: 05/30/16  4:11 AM  Result Value Ref Range   Sodium 142 135 - 145 mmol/L   Potassium 4.0 3.5 - 5.1 mmol/L   Chloride 109 101 - 111 mmol/L   CO2 25 22 - 32 mmol/L   Glucose, Bld 103 (H) 65 - 99  mg/dL   BUN 12 6 - 20 mg/dL   Creatinine, Ser 1.28 (H) 0.61 - 1.24 mg/dL   Calcium 9.1 8.9 - 10.3 mg/dL   GFR calc non Af Amer 47 (L) >60 mL/min   GFR calc Af Amer 54 (L) >60 mL/min   Anion gap 8 5 - 15  CBC     Status: None   Collection Time: 05/30/16  4:11 AM  Result Value Ref Range   WBC 6.5 4.0 - 10.5 K/uL   RBC 4.75 4.22 - 5.81 MIL/uL   Hemoglobin 15.0 13.0 - 17.0 g/dL   HCT 45.8 39.0 - 52.0 %   MCV 96.4 78.0 - 100.0 fL   MCH 31.6 26.0 - 34.0 pg   MCHC 32.8 30.0 - 36.0 g/dL   RDW 13.5 11.5 - 15.5 %   Platelets 224 150 - 400 K/uL  Lipid panel     Status: Abnormal   Collection Time: 05/30/16  4:11 AM  Result Value Ref Range   Cholesterol 174 0 - 200 mg/dL   Triglycerides 186 (H) <150 mg/dL   HDL 36 (L) >40 mg/dL   Total CHOL/HDL Ratio 4.8 RATIO   VLDL 37 0 - 40 mg/dL   LDL Cholesterol 101 (H) 0 - 99 mg/dL    Intake/Output Summary (Last 24 hours) at 05/30/16 1033 Last data filed at 05/29/16 1342  Gross per 24 hour  Intake                0 ml  Output              300 ml  Net             -300 ml     ASSESSMENT AND PLAN:  CHEST PAIN:   Cardiac enzymes are negative.  Echo prelim (I reviewed) normal LV function.  Evidence of diastolic dysfunction.  OK to go home with out patient follow up with Dr. Stanford Breed.    Jeneen Rinks [facility] 05/30/2016 10:33 AM

## 2016-05-30 NOTE — Progress Notes (Signed)
  Echocardiogram 2D Echocardiogram has been performed.  Jerry Berry 05/30/2016, 10:17 AM

## 2016-06-12 ENCOUNTER — Ambulatory Visit (INDEPENDENT_AMBULATORY_CARE_PROVIDER_SITE_OTHER): Payer: Medicare Other | Admitting: Physician Assistant

## 2016-06-12 ENCOUNTER — Encounter: Payer: Self-pay | Admitting: Physician Assistant

## 2016-06-12 VITALS — BP 118/68 | HR 77 | Ht 66.0 in | Wt 159.2 lb

## 2016-06-12 DIAGNOSIS — R079 Chest pain, unspecified: Secondary | ICD-10-CM

## 2016-06-12 DIAGNOSIS — R739 Hyperglycemia, unspecified: Secondary | ICD-10-CM

## 2016-06-12 DIAGNOSIS — E784 Other hyperlipidemia: Secondary | ICD-10-CM

## 2016-06-12 DIAGNOSIS — E785 Hyperlipidemia, unspecified: Secondary | ICD-10-CM

## 2016-06-12 NOTE — Progress Notes (Signed)
Cardiology Office Note   Date:  06/12/2016   ID:  Jerry Berry, DOB Nov 26, 1921, MRN [id number]  PCP:  Foye Spurling, MD  Cardiologist:  Dr Waynetta Pean, PA-C    History of Present Illness: Jerry Berry is a 82 y.o. male with a history of HTN, CKD III, LIGB, UC. EF 55% by echo 2011  D/C 08/26 after admit for CP>>ez neg MI, echo w/ EF 60-65%, grade 1 dd, mild MR  Jerry Berry presents for post hospital evaluation.  Since discharge from the hospital, he has done pretty well. He takes care of his wife at night, his daughter helps during the day. She is bedridden. This keeps him active and busy. He takes care of the house and yard. He feels that he eats well. He has had no chest pain or shortness of breath with activity. He feels that his symptoms are being well managed.  He denies dyspnea on exertion, orthopnea, or PND. He is able to do anything he needs to do does not feel his activity is limited in any way. He never gets lightheaded or dizzy. He never has palpitations.   Past Medical History:  Diagnosis Date  . Abdominal desmoid tumor   . Arthritis    "aching in shoulders, center of lower back" (05/29/2016)  . Bronchitis   . Chronic kidney disease    /notes 05/29/2016  . History of lower GI bleeding   . Hypertension   . Prostate pain   . Ulcerative colitis [facility])     Past Surgical History:  Procedure Laterality Date  . COLONOSCOPY N/A 05/30/2014   Procedure: COLONOSCOPY;  Surgeon: Milus Banister, MD;  Location: [place];  Service: Endoscopy;  Laterality: N/A;  . TOOTH EXTRACTION      Current Outpatient Prescriptions  Medication Sig Dispense Refill  . aspirin 81 MG chewable tablet Chew 1 tablet (81 mg total) by mouth at bedtime. Restart in 1 week    . diclofenac (VOLTAREN) 50 MG EC tablet Take 50 mg by mouth daily.    Marland Kitchen latanoprost (XALATAN) 0.005 % ophthalmic solution Place 1 drop into both eyes at bedtime.    . metoprolol tartrate (LOPRESSOR) 25  MG tablet Take 25 mg by mouth at bedtime.     . Multiple Vitamin (MULITIVITAMIN WITH MINERALS) TABS Take 1 tablet by mouth at bedtime.     . Tamsulosin HCl (FLOMAX) 0.4 MG CAPS Take 0.4 mg by mouth at bedtime.     No current facility-administered medications for this visit.     Allergies:   Review of patient's allergies indicates no known allergies.    Social History:  The patient  reports that he has never smoked. He has never used smokeless tobacco. He reports that he drinks about 4.2 oz of alcohol per week . He reports that he does not use drugs.   Family History:  The patient's family history includes Diabetes in his mother; Heart disease in his mother.    ROS:  Please see the history of present illness. All other systems are reviewed and negative.    PHYSICAL EXAM: VS:  BP 118/68   Pulse 77   Ht 5\' 6"  (1.676 m)   Wt 159 lb 3.2 oz (72.2 kg)   BMI 25.70 kg/m  , BMI Body mass index is 25.7 kg/m. GEN: Well nourished, well developed,Elderly male in no acute distress  HEENT: normal for age  Neck: Minimal JVD, no carotid bruit, no masses Cardiac: RRR; no murmur,  no rubs, or gallops Respiratory:  clear to auscultation bilaterally, normal work of breathing GI: soft, nontender, nondistended, + BS MS: no deformity or atrophy; no edema; distal pulses are 2+ in all 4 extremities   Skin: warm and dry, no rash Neuro:  Strength and sensation are intact Psych: euthymic mood, full affect   EKG:  EKG is not ordered today.  Recent Labs: 05/30/2016: BUN 12; Creatinine, Ser 1.28; Hemoglobin 15.0; Platelets 224; Potassium 4.0; Sodium 142    Lipid Panel    Component Value Date/Time   CHOL 174 05/30/2016 0411   TRIG 186 (H) 05/30/2016 0411   HDL 36 (L) 05/30/2016 0411   CHOLHDL 4.8 05/30/2016 0411   VLDL 37 05/30/2016 0411   LDLCALC 101 (H) 05/30/2016 0411     Wt Readings from Last 3 Encounters:  06/12/16 159 lb 3.2 oz (72.2 kg)  05/29/16 160 lb (72.6 kg)  05/31/14 156 lb 8.4 oz  (71 kg)     Other studies Reviewed: Additional studies/ records that were reviewed today include: Office notes, hospital records and testing.  ASSESSMENT AND PLAN:  1.  Chest pain, Moderate coronary risk: His cardiac enzymes were negative for MI. His ECG had no new changes. His echo showed normal wall motion and no significant abnormalities. Control of cardiac risk factors was recommended.   He is currently symptom free. He is currently on baby aspirin, and a low dose of metoprolol. He is not on a statin, but prefers not to be. Continue current therapy and call us for any problems or concerns.  2. Dyslipidemia: His HDL is low and his LDL is elevated. Dietary changes are recommended.  3. Hyperglycemia: His hemoglobin A1c was 6.9 during his recent hospitalization. It has been higher in the past. A heart healthy low cholesterol diet is recommended.  Current medicines are reviewed at length with the patient today.  The patient does not have concerns regarding medicines.  The following changes have been made:  no change  Labs/ tests ordered today include:  No orders of the defined types were placed in this encounter.    Disposition:   FU with Dr. Aundra Dubin  Signed, Sharday Michl, Suanne Marker, PA-C  06/12/2016 1:30 PM    [place] Phone: [phone]; Fax: [phone]  This note was written with the assistance of speech recognition software. Please excuse any transcriptional errors.

## 2016-06-12 NOTE — Patient Instructions (Signed)
Medications:  Your physician recommends that you continue on your current medications as directed. Please refer to the Current Medication list given to you today.    Follow-Up:  Your physician recommends that you schedule a follow-up appointment in: 3 months with Dr. Aundra Dubin at [facility] [address].  If you need a refill on your cardiac medications before your next appointment, please call your pharmacy.

## 2016-06-14 NOTE — Progress Notes (Signed)
Jerry Berry, I have not seen this patient since 2011.  Should followup with Dr Stanford Breed who saw him in the hospital.

## 2016-09-11 ENCOUNTER — Encounter (HOSPITAL_COMMUNITY): Payer: Self-pay

## 2016-09-11 ENCOUNTER — Ambulatory Visit (HOSPITAL_COMMUNITY)
Admission: RE | Admit: 2016-09-11 | Discharge: 2016-09-11 | Disposition: A | Discharge status: Home / Self Care | Payer: Medicare Other | Source: Ambulatory Visit | Attending: Cardiology | Admitting: Cardiology

## 2016-09-11 VITALS — BP 122/64 | HR 78 | Wt 157.0 lb

## 2016-09-11 DIAGNOSIS — N400 Enlarged prostate without lower urinary tract symptoms: Secondary | ICD-10-CM | POA: Diagnosis not present

## 2016-09-11 DIAGNOSIS — N189 Chronic kidney disease, unspecified: Secondary | ICD-10-CM | POA: Diagnosis not present

## 2016-09-11 DIAGNOSIS — I129 Hypertensive chronic kidney disease with stage 1 through stage 4 chronic kidney disease, or unspecified chronic kidney disease: Secondary | ICD-10-CM | POA: Diagnosis not present

## 2016-09-11 DIAGNOSIS — R079 Chest pain, unspecified: Secondary | ICD-10-CM | POA: Diagnosis not present

## 2016-09-11 DIAGNOSIS — Z7982 Long term (current) use of aspirin: Secondary | ICD-10-CM | POA: Diagnosis not present

## 2016-09-11 MED ORDER — METOPROLOL SUCCINATE ER 25 MG PO TB24: 12.5000 mg | ORAL_TABLET | Freq: Every day | ORAL | 3 refills | Status: DC

## 2016-09-11 NOTE — Patient Instructions (Signed)
STOP taking metoprolol tartrate  START taking Toprol XL 12.5 mg (0.5 Tab) Once daily at Bedtime  Follow up as needed.

## 2016-09-12 NOTE — Progress Notes (Signed)
PCP: Dr. Carlis Abbott  82 yo with history of HTN presents for cardiology followup. He was admitted in 8/17 with atypical chest pain. Cardiac enzymes were negative and echo showed normal EF.  He did not have a functional study done.  Since that time, he has done well.  No further chest pain.  No exertional dyspnea. He is able to do all ADLs without difficulty.    ECG: NSR, nonspecific T wave flattening  Labs (8/17): LDL 101  Past Medical History: 1. Hypertension 2. BPH 3. CKD 4. Chest pain: Echo (4/11): EF 70-75% with trivial aortic insufficiency.  Echo (8/17) with EF 60-65%, mild AI.   Family History: No premature CAD  Social History: The patient denies smoking, alcohol or any drug use.  He currently lives at home in [facility] and is functional with his ADLs.  Retired Pharmacist, hospital  Review of Systems       All systems reviewed and negative except as per HPI.   Current Outpatient Prescriptions  Medication Sig Dispense Refill  . aspirin 81 MG chewable tablet Chew 1 tablet (81 mg total) by mouth at bedtime. Restart in 1 week    . diclofenac (VOLTAREN) 50 MG EC tablet Take 50 mg by mouth daily.    Marland Kitchen latanoprost (XALATAN) 0.005 % ophthalmic solution Place 1 drop into both eyes at bedtime.    . Multiple Vitamin (MULITIVITAMIN WITH MINERALS) TABS Take 1 tablet by mouth at bedtime.     . Tamsulosin HCl (FLOMAX) 0.4 MG CAPS Take 0.4 mg by mouth at bedtime.    . metoprolol succinate (TOPROL XL) 25 MG 24 hr tablet Take 0.5 tablets (12.5 mg total) by mouth at bedtime. 15 tablet 3   No current facility-administered medications for this encounter.    BP 122/64   Pulse 78   Wt 157 lb (71.2 kg)   SpO2 99%   BMI 25.34 kg/m  General: NAD Neck: No JVD, no thyromegaly or thyroid nodule.  Lungs: Clear to auscultation bilaterally with normal respiratory effort. CV: Nondisplaced PMI.  Heart regular S1/S2, no S3/S4, no murmur.  No peripheral edema.  No carotid bruit.  Normal pedal pulses.  Abdomen:  Soft, nontender, no hepatosplenomegaly, no distention.  Skin: Intact without lesions or rashes.  Neurologic: Alert and oriented x 3.  Psych: Normal affect. Extremities: No clubbing or cyanosis.  HEENT: Normal.   Assessment/Plan: 1. Chest pain: No recurrence.  No further workup necessary.  2. HTN: BP is controlled.  He is taking short-acting metoprolol once daily in the evening.  He does not want to take it twice a day.  I will have him stop metoprolol tartrate and start Toprol XL 12.5 mg daily.   Cardiology followup as needed.   Jerry Berry 09/12/2016

## 2017-02-18 ENCOUNTER — Encounter (HOSPITAL_COMMUNITY): Payer: Self-pay | Admitting: *Deleted

## 2017-02-18 ENCOUNTER — Ambulatory Visit (HOSPITAL_COMMUNITY)
Admission: EM | Admit: 2017-02-18 | Discharge: 2017-02-18 | Disposition: A | Discharge status: Home / Self Care | Payer: Medicare Other | Attending: Family Medicine | Admitting: Family Medicine

## 2017-02-18 DIAGNOSIS — W57XXXA Bitten or stung by nonvenomous insect and other nonvenomous arthropods, initial encounter: Secondary | ICD-10-CM | POA: Diagnosis not present

## 2017-02-18 DIAGNOSIS — S30860A Insect bite (nonvenomous) of lower back and pelvis, initial encounter: Secondary | ICD-10-CM

## 2017-02-18 MED ORDER — HYDROXYZINE HCL 25 MG PO TABS: 25.0000 mg | ORAL_TABLET | Freq: Four times a day (QID) | ORAL | 0 refills | Status: DC

## 2017-02-18 NOTE — ED Triage Notes (Signed)
Pt noticed    An  Insect   On  Back  Yesterday  Has   A   Sensation   Of  Swelling   And  Is  Tender  To the  Touch       Also  Has   Bumps  On r   Side   abd  As   Well

## 2017-02-18 NOTE — ED Provider Notes (Signed)
CSN: [id number]     Arrival date & time 02/18/17  1156 History   None    Chief Complaint  Patient presents with  . Rash   (Consider location/radiation/quality/duration/timing/severity/associated sxs/prior Treatment) Patient c/o insect bites on his back from yesterday.   The history is provided by the patient.  Rash  Location:  Full body Quality: blistering, itchiness and redness   Severity:  Mild Onset quality:  Sudden Duration:  2 days Timing:  Constant Progression:  Worsening Relieved by:  Nothing Worsened by:  Nothing   Past Medical History:  Diagnosis Date  . Abdominal desmoid tumor   . Arthritis    "aching in shoulders, center of lower back" (05/29/2016)  . Bronchitis   . Chronic kidney disease    /notes 05/29/2016  . History of lower GI bleeding   . Hypertension   . Prostate pain   . Ulcerative colitis [facility])    Past Surgical History:  Procedure Laterality Date  . COLONOSCOPY N/A 05/30/2014   Procedure: COLONOSCOPY;  Surgeon: Milus Banister, MD;  Location: [place];  Service: Endoscopy;  Laterality: N/A;  . TOOTH EXTRACTION     Family History  Problem Relation Age of Onset  . Heart disease Mother   . Diabetes Mother    Social History  Substance Use Topics  . Smoking status: Never Smoker  . Smokeless tobacco: Never Used  . Alcohol use 4.2 oz/week    7 Cans of beer per week    Review of Systems  Constitutional: Negative.   HENT: Negative.   Eyes: Negative.   Respiratory: Negative.   Cardiovascular: Negative.   Gastrointestinal: Negative.   Endocrine: Negative.   Genitourinary: Negative.   Musculoskeletal: Negative.   Skin: Positive for rash.  Allergic/Immunologic: Negative.   Neurological: Negative.   Hematological: Negative.   Psychiatric/Behavioral: Negative.     Allergies  Patient has no known allergies.  Home Medications   Prior to Admission medications   Medication Sig Start Date End Date Taking? Authorizing Provider  aspirin  81 MG chewable tablet Chew 1 tablet (81 mg total) by mouth at bedtime. Restart in 1 week 05/31/14   Domenic Polite, MD  diclofenac (VOLTAREN) 50 MG EC tablet Take 50 mg by mouth daily.    [provider]  hydrOXYzine (ATARAX/VISTARIL) 25 MG tablet Take 1 tablet (25 mg total) by mouth every 6 (six) hours. 02/18/17   Lysbeth Penner, FNP  latanoprost (XALATAN) 0.005 % ophthalmic solution Place 1 drop into both eyes at bedtime.    [provider]  metoprolol succinate (TOPROL XL) 25 MG 24 hr tablet Take 0.5 tablets (12.5 mg total) by mouth at bedtime. 09/11/16   Larey Dresser, MD  Multiple Vitamin (MULITIVITAMIN WITH MINERALS) TABS Take 1 tablet by mouth at bedtime.     [provider]  Tamsulosin HCl (FLOMAX) 0.4 MG CAPS Take 0.4 mg by mouth at bedtime.    [provider]   Meds Ordered and Administered this Visit  Medications - No data to display  BP (!) 147/76 (BP Location: Left Arm) Comment: notified rn  Pulse 79   Temp 98.2 F (36.8 C) (Oral)   Resp 16   SpO2 100%  No data found.   Physical Exam  Constitutional: He appears well-developed and well-nourished.  HENT:  Head: Normocephalic and atraumatic.  Eyes: Conjunctivae and EOM are normal. Pupils are equal, round, and reactive to light.  Neck: Normal range of motion. Neck supple.  Cardiovascular: Normal rate,  regular rhythm and normal heart sounds.   Pulmonary/Chest: Effort normal and breath sounds normal.  Genitourinary: Penis normal.  Genitourinary Comments: No inguinal hernia bilateral.  Skin: Rash noted.  Erythematous raised areas approx 3 cm diameter #3 on right upper mid and lower back.  Nursing note and vitals reviewed.   Urgent Care Course     Procedures (including critical care time)  Labs Review Labs Reviewed - No data to display  Imaging Review No results found.   Visual Acuity Review  Right Eye Distance:   Left Eye Distance:   Bilateral Distance:    Right Eye  Near:   Left Eye Near:    Bilateral Near:         MDM   1. Insect bite, initial encounter    Hydroxyzine 25mg  one po q 6 hours prn #12 Can continue bacitracin ointment.      Lysbeth Penner, [place] 02/18/17 [phone]

## 2017-02-24 ENCOUNTER — Other Ambulatory Visit: Payer: Self-pay | Admitting: Internal Medicine

## 2017-02-24 ENCOUNTER — Ambulatory Visit (HOSPITAL_COMMUNITY)
Admission: RE | Admit: 2017-02-24 | Discharge: 2017-02-24 | Disposition: A | Discharge status: Home / Self Care | Payer: Medicare Other | Source: Ambulatory Visit | Attending: Internal Medicine | Admitting: Internal Medicine

## 2017-02-24 DIAGNOSIS — R1031 Right lower quadrant pain: Secondary | ICD-10-CM | POA: Insufficient documentation

## 2017-02-24 DIAGNOSIS — I700 Atherosclerosis of aorta: Secondary | ICD-10-CM | POA: Insufficient documentation

## 2017-03-01 ENCOUNTER — Other Ambulatory Visit (HOSPITAL_COMMUNITY): Payer: Self-pay | Admitting: Cardiology

## 2017-09-21 ENCOUNTER — Other Ambulatory Visit: Payer: Self-pay | Admitting: Internal Medicine

## 2017-09-21 DIAGNOSIS — R109 Unspecified abdominal pain: Secondary | ICD-10-CM

## 2017-09-21 DIAGNOSIS — R634 Abnormal weight loss: Secondary | ICD-10-CM

## 2017-09-24 ENCOUNTER — Encounter (HOSPITAL_COMMUNITY): Payer: Self-pay

## 2017-09-24 ENCOUNTER — Ambulatory Visit (HOSPITAL_COMMUNITY)
Admission: RE | Admit: 2017-09-24 | Discharge: 2017-09-24 | Disposition: A | Discharge status: Home / Self Care | Payer: Medicare Other | Source: Ambulatory Visit | Attending: Internal Medicine | Admitting: Internal Medicine

## 2017-09-24 DIAGNOSIS — R109 Unspecified abdominal pain: Secondary | ICD-10-CM | POA: Diagnosis present

## 2017-09-24 DIAGNOSIS — N281 Cyst of kidney, acquired: Secondary | ICD-10-CM | POA: Insufficient documentation

## 2017-09-24 DIAGNOSIS — I700 Atherosclerosis of aorta: Secondary | ICD-10-CM | POA: Diagnosis not present

## 2017-09-24 DIAGNOSIS — K769 Liver disease, unspecified: Secondary | ICD-10-CM | POA: Insufficient documentation

## 2017-09-24 DIAGNOSIS — R634 Abnormal weight loss: Secondary | ICD-10-CM | POA: Insufficient documentation

## 2017-09-24 DIAGNOSIS — N400 Enlarged prostate without lower urinary tract symptoms: Secondary | ICD-10-CM | POA: Insufficient documentation

## 2017-09-25 ENCOUNTER — Observation Stay (HOSPITAL_COMMUNITY)
Admission: EM | Admit: 2017-09-25 | Discharge: 2017-09-27 | Disposition: A | Discharge status: Home / Self Care | Payer: Medicare Other | Attending: Internal Medicine | Admitting: Internal Medicine

## 2017-09-25 ENCOUNTER — Other Ambulatory Visit: Payer: Self-pay

## 2017-09-25 ENCOUNTER — Encounter (HOSPITAL_COMMUNITY): Payer: Self-pay | Admitting: *Deleted

## 2017-09-25 ENCOUNTER — Observation Stay (HOSPITAL_COMMUNITY): Payer: Medicare Other

## 2017-09-25 DIAGNOSIS — I129 Hypertensive chronic kidney disease with stage 1 through stage 4 chronic kidney disease, or unspecified chronic kidney disease: Secondary | ICD-10-CM | POA: Diagnosis not present

## 2017-09-25 DIAGNOSIS — M1990 Unspecified osteoarthritis, unspecified site: Secondary | ICD-10-CM | POA: Diagnosis not present

## 2017-09-25 DIAGNOSIS — R1013 Epigastric pain: Secondary | ICD-10-CM | POA: Insufficient documentation

## 2017-09-25 DIAGNOSIS — N183 Chronic kidney disease, stage 3 (moderate): Secondary | ICD-10-CM | POA: Diagnosis not present

## 2017-09-25 DIAGNOSIS — E860 Dehydration: Secondary | ICD-10-CM | POA: Diagnosis not present

## 2017-09-25 DIAGNOSIS — Z7982 Long term (current) use of aspirin: Secondary | ICD-10-CM | POA: Insufficient documentation

## 2017-09-25 DIAGNOSIS — R110 Nausea: Secondary | ICD-10-CM | POA: Diagnosis not present

## 2017-09-25 DIAGNOSIS — R932 Abnormal findings on diagnostic imaging of liver and biliary tract: Secondary | ICD-10-CM | POA: Insufficient documentation

## 2017-09-25 DIAGNOSIS — K869 Disease of pancreas, unspecified: Secondary | ICD-10-CM | POA: Diagnosis not present

## 2017-09-25 DIAGNOSIS — R948 Abnormal results of function studies of other organs and systems: Secondary | ICD-10-CM

## 2017-09-25 DIAGNOSIS — K8689 Other specified diseases of pancreas: Secondary | ICD-10-CM

## 2017-09-25 DIAGNOSIS — K769 Liver disease, unspecified: Secondary | ICD-10-CM

## 2017-09-25 DIAGNOSIS — R630 Anorexia: Secondary | ICD-10-CM | POA: Diagnosis not present

## 2017-09-25 DIAGNOSIS — Z79899 Other long term (current) drug therapy: Secondary | ICD-10-CM | POA: Insufficient documentation

## 2017-09-25 DIAGNOSIS — C787 Secondary malignant neoplasm of liver and intrahepatic bile duct: Secondary | ICD-10-CM

## 2017-09-25 LAB — COMPREHENSIVE METABOLIC PANEL
ALT: 30 U/L (ref 17–63)
ANION GAP: 10 (ref 5–15)
AST: 51 U/L — ABNORMAL HIGH (ref 15–41)
Albumin: 3.7 g/dL (ref 3.5–5.0)
Alkaline Phosphatase: 232 U/L — ABNORMAL HIGH (ref 38–126)
BUN: 23 mg/dL — ABNORMAL HIGH (ref 6–20)
CHLORIDE: 105 mmol/L (ref 101–111)
CO2: 26 mmol/L (ref 22–32)
Calcium: 9.3 mg/dL (ref 8.9–10.3)
Creatinine, Ser: 1.48 mg/dL — ABNORMAL HIGH (ref 0.61–1.24)
GFR calc Af Amer: 45 mL/min — ABNORMAL LOW (ref >60)
GFR, EST NON AFRICAN AMERICAN: 39 mL/min — AB (ref >60)
Glucose, Bld: 124 mg/dL — ABNORMAL HIGH (ref 65–99)
POTASSIUM: 4.8 mmol/L (ref 3.5–5.1)
Sodium: 141 mmol/L (ref 135–145)
Total Bilirubin: 1 mg/dL (ref 0.3–1.2)
Total Protein: 7.7 g/dL (ref 6.5–8.1)

## 2017-09-25 LAB — CBC WITH DIFFERENTIAL/PLATELET
BASOS ABS: 0 10*3/uL (ref 0.0–0.1)
Basophils Relative: 0 %
EOS PCT: 3 %
Eosinophils Absolute: 0.3 10*3/uL (ref 0.0–0.7)
HCT: 37.2 % — ABNORMAL LOW (ref 39.0–52.0)
Hemoglobin: 12.7 g/dL — ABNORMAL LOW (ref 13.0–17.0)
LYMPHS PCT: 23 %
Lymphs Abs: 2 10*3/uL (ref 0.7–4.0)
MCH: 31.5 pg (ref 26.0–34.0)
MCHC: 34.1 g/dL (ref 30.0–36.0)
MCV: 92.3 fL (ref 78.0–100.0)
MONO ABS: 0.8 10*3/uL (ref 0.1–1.0)
Monocytes Relative: 9 %
Neutro Abs: 5.6 10*3/uL (ref 1.7–7.7)
Neutrophils Relative %: 65 %
PLATELETS: 264 10*3/uL (ref 150–400)
RBC: 4.03 MIL/uL — ABNORMAL LOW (ref 4.22–5.81)
RDW: 12.9 % (ref 11.5–15.5)
WBC: 8.7 10*3/uL (ref 4.0–10.5)

## 2017-09-25 LAB — LIPASE, BLOOD: LIPASE: 94 U/L — AB (ref 11–51)

## 2017-09-25 LAB — I-STAT TROPONIN, ED: Troponin i, poc: 0 ng/mL (ref 0.00–0.08)

## 2017-09-25 MED ORDER — ONDANSETRON HCL 4 MG/2ML IJ SOLN
4.0000 mg | Freq: Once | INTRAMUSCULAR | Status: AC
  Administered 2017-09-25: 4 mg via INTRAVENOUS
  Filled 2017-09-25: qty 2

## 2017-09-25 MED ORDER — SODIUM CHLORIDE 0.9 % IV SOLN
INTRAVENOUS | Status: DC
  Administered 2017-09-26 (×2): via INTRAVENOUS

## 2017-09-25 MED ORDER — BOOST / RESOURCE BREEZE PO LIQD CUSTOM
1.0000 | Freq: Three times a day (TID) | ORAL | Status: DC
  Administered 2017-09-26 (×2): 1 via ORAL

## 2017-09-25 MED ORDER — PANTOPRAZOLE SODIUM 40 MG PO TBEC
40.0000 mg | DELAYED_RELEASE_TABLET | Freq: Every day | ORAL | Status: DC
  Administered 2017-09-26 – 2017-09-27 (×2): 40 mg via ORAL
  Filled 2017-09-25 (×3): qty 1

## 2017-09-25 MED ORDER — TRAMADOL HCL 50 MG PO TABS: 50.0000 mg | ORAL_TABLET | Freq: Two times a day (BID) | ORAL | Status: DC | PRN

## 2017-09-25 MED ORDER — ONDANSETRON HCL 4 MG PO TABS: 4.0000 mg | ORAL_TABLET | Freq: Four times a day (QID) | ORAL | Status: DC | PRN

## 2017-09-25 MED ORDER — ACETAMINOPHEN 650 MG RE SUPP: 650.0000 mg | Freq: Four times a day (QID) | RECTAL | Status: DC | PRN

## 2017-09-25 MED ORDER — MORPHINE SULFATE (PF) 2 MG/ML IV SOLN
2.0000 mg | Freq: Once | INTRAVENOUS | Status: AC
  Administered 2017-09-25: 2 mg via INTRAVENOUS
  Filled 2017-09-25: qty 1

## 2017-09-25 MED ORDER — MORPHINE SULFATE (PF) 2 MG/ML IV SOLN: 1.0000 mg | INTRAVENOUS | Status: DC | PRN

## 2017-09-25 MED ORDER — ACETAMINOPHEN 325 MG PO TABS: 650.0000 mg | ORAL_TABLET | Freq: Four times a day (QID) | ORAL | Status: DC | PRN

## 2017-09-25 MED ORDER — GADOBENATE DIMEGLUMINE 529 MG/ML IV SOLN
15.0000 mL | Freq: Once | INTRAVENOUS | Status: AC | PRN
  Administered 2017-09-25: 14 mL via INTRAVENOUS

## 2017-09-25 MED ORDER — LATANOPROST 0.005 % OP SOLN
1.0000 [drp] | Freq: Every day | OPHTHALMIC | Status: DC
  Administered 2017-09-25: 1 [drp] via OPHTHALMIC
  Filled 2017-09-25: qty 2.5

## 2017-09-25 MED ORDER — DICLOFENAC SODIUM 50 MG PO TBEC: 50.0000 mg | DELAYED_RELEASE_TABLET | Freq: Every day | ORAL | Status: DC

## 2017-09-25 MED ORDER — ONDANSETRON HCL 4 MG/2ML IJ SOLN: 4.0000 mg | Freq: Four times a day (QID) | INTRAMUSCULAR | Status: DC | PRN

## 2017-09-25 MED ORDER — TAMSULOSIN HCL 0.4 MG PO CAPS
0.4000 mg | ORAL_CAPSULE | Freq: Every day | ORAL | Status: DC
  Administered 2017-09-25 – 2017-09-26 (×2): 0.4 mg via ORAL
  Filled 2017-09-25: qty 1

## 2017-09-25 MED ORDER — ENOXAPARIN SODIUM 30 MG/0.3ML ~~LOC~~ SOLN
30.0000 mg | SUBCUTANEOUS | Status: DC
  Filled 2017-09-25: qty 0.3

## 2017-09-25 MED ORDER — SODIUM CHLORIDE 0.9 % IV BOLUS (SEPSIS)
500.0000 mL | Freq: Once | INTRAVENOUS | Status: AC
  Administered 2017-09-25: 500 mL via INTRAVENOUS

## 2017-09-25 NOTE — H&P (Signed)
History and Physical    Jerry Berry ZSW:[id number] DOB: 1922/08/26 DOA: 09/25/2017  PCP: Foye Spurling, MD  Patient coming from: Home  I have personally briefly reviewed patient's old medical records in [place]  Chief Complaint: abdominal pain. Since many months.   HPI: Jerry Berry is a 83 y.o. male with medical history significant of  Stage 3 ckd, arthritis, hypertension, comes in with persistent nausea, and epigastric pain going on since July, associated with loss  Of appetite and loss of weight. He denies any fever or chills or night sweats. He denies any hematochezia, or hematemesis. No chest pain or sob. He reports neck pain and arthritis.  On arrival to ED, lab work revealed elevated lipase at 94, elevated AST, creatinine of 1.48, hemoglobin of 12.7. CT abdomen and pelvis showed pancreatic mass with liver mets. It was followed with MRI of the abdomen showed Pancreatic head mass with extensive metastatic disease to the liver. Overall appearance favors pancreatic ductal adenocarcinoma with extensive metastatic disease to the liver and associated pathologic adenopathy in the porta hepatis. He was referred to medical service for observation. He denies any headache and dizziness or sensory deficits. No vomiting.    Review of Systems:  all others reviewed and are negative.   Past Medical History:  Diagnosis Date  . Abdominal desmoid tumor   . Arthritis    "aching in shoulders, center of lower back" (05/29/2016)  . Bronchitis   . Chronic kidney disease    /notes 05/29/2016  . History of lower GI bleeding   . Hypertension   . Prostate pain   . Ulcerative colitis [facility])     Past Surgical History:  Procedure Laterality Date  . COLONOSCOPY N/A 05/30/2014   Procedure: COLONOSCOPY;  Surgeon: Milus Banister, MD;  Location: [place];  Service: Endoscopy;  Laterality: N/A;  . TOOTH EXTRACTION       reports that  has never smoked. he has never used smokeless tobacco. He  reports that he drinks about 4.2 oz of alcohol per week. He reports that he does not use drugs.  No Known Allergies  Family History  Problem Relation Age of Onset  . Heart disease Mother   . Diabetes Mother    Reviewed and not pertinent.   Prior to Admission medications   Medication Sig Start Date End Date Taking? Authorizing Provider  aspirin 81 MG chewable tablet Chew 1 tablet (81 mg total) by mouth at bedtime. Restart in 1 week 05/31/14   Domenic Polite, MD  diclofenac (VOLTAREN) 50 MG EC tablet Take 50 mg by mouth daily.    [provider]  hydrOXYzine (ATARAX/VISTARIL) 25 MG tablet Take 1 tablet (25 mg total) by mouth every 6 (six) hours. 02/18/17   Lysbeth Penner, FNP  latanoprost (XALATAN) 0.005 % ophthalmic solution Place 1 drop into both eyes at bedtime.    [provider]  metoprolol succinate (TOPROL-XL) 25 MG 24 hr tablet take 1/2 tablet by mouth at bedtime 03/03/17   Larey Dresser, MD  Multiple Vitamin (MULITIVITAMIN WITH MINERALS) TABS Take 1 tablet by mouth at bedtime.     [provider]  Tamsulosin HCl (FLOMAX) 0.4 MG CAPS Take 0.4 mg by mouth at bedtime.    [provider]    Physical Exam: Vitals:   09/25/17 1548 09/25/17 1646 09/25/17 1657 09/25/17 1800  BP: 135/74 125/69  125/69  Pulse: 93 84 83 89  Resp: 16   18  Temp: 98.5 F (  36.9 C)     TempSrc: Oral     SpO2: 99% 95% 100% 99%  Weight: 63.5 kg (140 lb)     Height: 5\' 6"  (1.676 m)       Constitutional: NAD, calm, comfortable Vitals:   09/25/17 1548 09/25/17 1646 09/25/17 1657 09/25/17 1800  BP: 135/74 125/69  125/69  Pulse: 93 84 83 89  Resp: 16   18  Temp: 98.5 F (36.9 C)     TempSrc: Oral     SpO2: 99% 95% 100% 99%  Weight: 63.5 kg (140 lb)     Height: 5\' 6"  (1.676 m)      Eyes: PERRL, lids and conjunctivae normal ENMT: Mucous membranes are moist. Posterior pharynx clear of any exudate or lesions.Normal dentition.  Neck: normal, supple, no masses, no  thyromegaly Respiratory: clear to auscultation bilaterally, no wheezing, no crackles. Normal respiratory effort. No accessory muscle use.  Cardiovascular: Regular rate and rhythm, no murmurs / rubs / gallops. No extremity edema. 2+ pedal pulses. No carotid bruits.  Abdomen: abd is soft , tender in the epigastric area. Bowel sounds are good.   Musculoskeletal: no clubbing / cyanosis. No joint deformity upper and lower extremities. Good ROM, no contractures. Normal muscle tone.  Skin: no rashes, lesions, ulcers. No induration Neurologic: CN 2-12 grossly intact. Sensation intact, DTR normal. Strength 5/5 in all 4.  Psychiatric: Normal judgment and insight. Alert and oriented x 3. Normal mood.     Labs on Admission: I have personally reviewed following labs and imaging studies  CBC: Recent Labs  Lab 09/25/17 1628  WBC 8.7  NEUTROABS 5.6  HGB 12.7*  HCT 37.2*  MCV 92.3  PLT 829   Basic Metabolic Panel: Recent Labs  Lab 09/25/17 1628  NA 141  K 4.8  CL 105  CO2 26  GLUCOSE 124*  BUN 23*  CREATININE 1.48*  CALCIUM 9.3   GFR: Estimated Creatinine Clearance: 28 mL/min (A) (by C-G formula based on SCr of 1.48 mg/dL (H)). Liver Function Tests: Recent Labs  Lab 09/25/17 1628  AST 51*  ALT 30  ALKPHOS 232*  BILITOT 1.0  PROT 7.7  ALBUMIN 3.7   Recent Labs  Lab 09/25/17 1628  LIPASE 94*   No results for input(s): AMMONIA in the last 168 hours. Coagulation Profile: No results for input(s): INR, PROTIME in the last 168 hours. Cardiac Enzymes: No results for input(s): CKTOTAL, CKMB, CKMBINDEX, TROPONINI in the last 168 hours. BNP (last 3 results) No results for input(s): PROBNP in the last 8760 hours. HbA1C: No results for input(s): HGBA1C in the last 72 hours. CBG: No results for input(s): GLUCAP in the last 168 hours. Lipid Profile: No results for input(s): CHOL, HDL, LDLCALC, TRIG, CHOLHDL, LDLDIRECT in the last 72 hours. Thyroid Function Tests: No results for  input(s): TSH, T4TOTAL, FREET4, T3FREE, THYROIDAB in the last 72 hours. Anemia Panel: No results for input(s): VITAMINB12, FOLATE, FERRITIN, TIBC, IRON, RETICCTPCT in the last 72 hours. Urine analysis:    Component Value Date/Time   COLORURINE YELLOW 06/29/2012 [place] 06/29/2012 1602   LABSPEC 1.019 06/29/2012 1602   PHURINE 5.5 06/29/2012 1602   GLUCOSEU NEGATIVE 06/29/2012 1602   HGBUR NEGATIVE 06/29/2012 1602   BILIRUBINUR NEGATIVE 06/29/2012 1602   KETONESUR NEGATIVE 06/29/2012 1602   PROTEINUR NEGATIVE 06/29/2012 1602   UROBILINOGEN 0.2 06/29/2012 1602   NITRITE NEGATIVE 06/29/2012 1602   LEUKOCYTESUR NEGATIVE 06/29/2012 1602    Radiological Exams on Admission: Ct Abdomen  Pelvis Wo Contrast  Result Date: 09/24/2017 CLINICAL DATA:  Weight loss, mid abdominal pain. EXAM: CT CHEST, ABDOMEN AND PELVIS WITHOUT CONTRAST TECHNIQUE: Multidetector CT imaging of the chest, abdomen and pelvis was performed following the standard protocol without IV contrast. COMPARISON:  CT scan of May 27, 2014. FINDINGS: CT CHEST FINDINGS Cardiovascular: Atherosclerosis of thoracic aorta is noted without aneurysm formation. Normal cardiac size. No pericardial effusion. Mild coronary artery calcifications are noted. Mediastinum/Nodes: No enlarged mediastinal, hilar, or axillary lymph nodes. Thyroid gland, trachea, and esophagus demonstrate no significant findings. Lungs/Pleura: No pneumothorax or pleural effusion is noted. No acute pulmonary disease is noted. Musculoskeletal: No chest wall mass or suspicious bone lesions identified. CT ABDOMEN PELVIS FINDINGS Hepatobiliary: Multiple rounded low densities are noted throughout hepatic parenchyma consistent with metastatic disease. No gallstones are noted. Pancreas: Possible 3.5 cm solid mass is seen in pancreatic head concerning for malignancy. Further evaluation with MRI is recommended. No ductal dilatation is noted. Spleen: Normal in size  without focal abnormality. Adrenals/Urinary Tract: Adrenal glands are unremarkable. Stable right renal cysts are noted. No hydronephrosis or renal obstruction is noted. No renal or ureteral calculi are noted. Urinary bladder is unremarkable. Stomach/Bowel: Stomach is within normal limits. Appendix appears normal. No evidence of bowel wall thickening, distention, or inflammatory changes. Stool is noted throughout the colon. Vascular/Lymphatic: Aortic atherosclerosis. No enlarged abdominal or pelvic lymph nodes. Reproductive: Moderate prostatic enlargement is noted. Other: No abdominal wall hernia or abnormality. No abdominopelvic ascites. Musculoskeletal: No acute or significant osseous findings. IMPRESSION: Multiple rounded low densities are noted throughout hepatic parenchyma consistent with metastatic disease. Possible 3.5 cm solid mass seen in pancreatic head concerning for malignancy. MRI is recommended for further evaluation. Atherosclerosis of thoracic and abdominal aorta is noted without aneurysm formation. Stable right renal cysts. Moderate prostatic enlargement. These results will be called to the ordering clinician or representative by the Radiologist Assistant, and communication documented in the PACS or zVision Dashboard. Electronically Signed   By: Marijo Conception, M.D.   On: 09/24/2017 14:30   Ct Chest Wo Contrast  Result Date: 09/24/2017 CLINICAL DATA:  Weight loss, mid abdominal pain. EXAM: CT CHEST, ABDOMEN AND PELVIS WITHOUT CONTRAST TECHNIQUE: Multidetector CT imaging of the chest, abdomen and pelvis was performed following the standard protocol without IV contrast. COMPARISON:  CT scan of May 27, 2014. FINDINGS: CT CHEST FINDINGS Cardiovascular: Atherosclerosis of thoracic aorta is noted without aneurysm formation. Normal cardiac size. No pericardial effusion. Mild coronary artery calcifications are noted. Mediastinum/Nodes: No enlarged mediastinal, hilar, or axillary lymph nodes. Thyroid  gland, trachea, and esophagus demonstrate no significant findings. Lungs/Pleura: No pneumothorax or pleural effusion is noted. No acute pulmonary disease is noted. Musculoskeletal: No chest wall mass or suspicious bone lesions identified. CT ABDOMEN PELVIS FINDINGS Hepatobiliary: Multiple rounded low densities are noted throughout hepatic parenchyma consistent with metastatic disease. No gallstones are noted. Pancreas: Possible 3.5 cm solid mass is seen in pancreatic head concerning for malignancy. Further evaluation with MRI is recommended. No ductal dilatation is noted. Spleen: Normal in size without focal abnormality. Adrenals/Urinary Tract: Adrenal glands are unremarkable. Stable right renal cysts are noted. No hydronephrosis or renal obstruction is noted. No renal or ureteral calculi are noted. Urinary bladder is unremarkable. Stomach/Bowel: Stomach is within normal limits. Appendix appears normal. No evidence of bowel wall thickening, distention, or inflammatory changes. Stool is noted throughout the colon. Vascular/Lymphatic: Aortic atherosclerosis. No enlarged abdominal or pelvic lymph nodes. Reproductive: Moderate prostatic enlargement is noted. Other: No abdominal  wall hernia or abnormality. No abdominopelvic ascites. Musculoskeletal: No acute or significant osseous findings. IMPRESSION: Multiple rounded low densities are noted throughout hepatic parenchyma consistent with metastatic disease. Possible 3.5 cm solid mass seen in pancreatic head concerning for malignancy. MRI is recommended for further evaluation. Atherosclerosis of thoracic and abdominal aorta is noted without aneurysm formation. Stable right renal cysts. Moderate prostatic enlargement. These results will be called to the ordering clinician or representative by the Radiologist Assistant, and communication documented in the PACS or zVision Dashboard. Electronically Signed   By: Marijo Conception, M.D.   On: 09/24/2017 14:30    EKG:  Independently reviewed. NSR.   Assessment/Plan Active Problems:   Dehydration    DECREASED appetite , weight loss, nausea and abdominal pain, and CT abdomen and pelvis showing evidence of metastatic pancreatic cancer. Follow up with MRI abdomen ordered and showed possible metastatic adenocarcinoma.  Oncology will be consulted in am for recommendations.  CA- 19-9.  Currently patient appears dehydrated , would admit him for hydration and pain control.  Gentle hydration, IV pain meds, and clear liquid diet.     Mild Acute on Stage 3 CKD: Creatinine slightly worse than baseline.  Gentle hydration.    Mild to mod protein calorie malnutrition:  - nutrition consulted.   - PT evaluation.    Anemia:  Normocytic.  Continue to monitor.    Hypertension:  Well controlled.   DVT prophylaxis: LOVENOX.  Code Status: full code.  Family Communication: family at bedside.  Disposition Plan: pending evaluation of oncology in am.  Consults called: none, will notify oncology in am.  Admission status: obs/ med-surg.    Hosie Poisson MD [facility] Pager [phone]  If 7PM-7AM, please contact night-coverage [email or web address] Password [facility]  09/25/2017, 7:06 PM

## 2017-09-25 NOTE — ED Triage Notes (Signed)
Pt daughter states pt has had decreased appetite for the past month. Pt went to his PCP, who prescribed him something to help him with his appetite and a sleeping medication. Pt had CT yesterday, but has not been read. Pt was told to come to ED if he is not feeling better. Pt complains of abdominal pain, which is worse with palpation.

## 2017-09-25 NOTE — ED Notes (Signed)
Bed: [facility] Expected date:  Expected time:  Means of arrival:  Comments: 

## 2017-09-25 NOTE — ED Provider Notes (Signed)
[place] DEPT Provider Note   CSN: [id number] Arrival date & time: 09/25/17  1528     History   Chief Complaint Chief Complaint  Patient presents with  . Anorexia  . Abdominal Pain    HPI Logon Uttech is a 83 y.o. male history of CKD, hypertension who presented with nausea, epigastric pain.  Patient states that about the last month or so, patient has been having poor appetite and nausea.  States that the food has changed taste and he no longer contains any flavors.  He was started on medicine to increase his appetite but has not been helping.  He has constant abdominal pain in the epigastric area radiate to his lower chest.  Patient had CT chest abdomen pelvis done yesterday that showed metastatic disease to the liver as well as likely pancreatic mass.  Patient was not notified about the results yet and wanted to find out about the results today.  Denies fevers or actual vomiting.   The history is provided by the patient.    Past Medical History:  Diagnosis Date  . Abdominal desmoid tumor   . Arthritis    "aching in shoulders, center of lower back" (05/29/2016)  . Bronchitis   . Chronic kidney disease    /notes 05/29/2016  . History of lower GI bleeding   . Hypertension   . Prostate pain   . Ulcerative colitis [facility])     Patient Active Problem List   Diagnosis Date Noted  . Chest pain 05/29/2016  . Diverticular hemorrhage 05/31/2014  . Orthostatic hypotension 05/29/2014  . Syncope and collapse 05/28/2014  . Acute post-hemorrhagic anemia 05/28/2014  . Essential hypertension, benign 05/28/2014  . Stage III chronic kidney disease ([place]) 05/28/2014  . BRBPR (bright red blood per rectum) 05/27/2014  . CKD (chronic kidney disease) stage 3, GFR 30-59 ml/min (HCC) 05/27/2014  . CAP (community acquired pneumonia) 02/13/2012  . Fever and chills 02/13/2012  . ARF (acute renal failure) ([place]) 02/13/2012  . BPH (benign prostatic hyperplasia)  02/13/2012  . HYPERTENSION, UNSPECIFIED 02/20/2010  . CHEST PAIN 02/20/2010    Past Surgical History:  Procedure Laterality Date  . COLONOSCOPY N/A 05/30/2014   Procedure: COLONOSCOPY;  Surgeon: Milus Banister, MD;  Location: [place];  Service: Endoscopy;  Laterality: N/A;  . TOOTH EXTRACTION         Home Medications    Prior to Admission medications   Medication Sig Start Date End Date Taking? Authorizing Provider  aspirin 81 MG chewable tablet Chew 1 tablet (81 mg total) by mouth at bedtime. Restart in 1 week 05/31/14   Domenic Polite, MD  diclofenac (VOLTAREN) 50 MG EC tablet Take 50 mg by mouth daily.    [provider]  hydrOXYzine (ATARAX/VISTARIL) 25 MG tablet Take 1 tablet (25 mg total) by mouth every 6 (six) hours. 02/18/17   Lysbeth Penner, FNP  latanoprost (XALATAN) 0.005 % ophthalmic solution Place 1 drop into both eyes at bedtime.    [provider]  metoprolol succinate (TOPROL-XL) 25 MG 24 hr tablet take 1/2 tablet by mouth at bedtime 03/03/17   Larey Dresser, MD  Multiple Vitamin (MULITIVITAMIN WITH MINERALS) TABS Take 1 tablet by mouth at bedtime.     [provider]  Tamsulosin HCl (FLOMAX) 0.4 MG CAPS Take 0.4 mg by mouth at bedtime.    [provider]    Family History Family History  Problem Relation Age of Onset  . Heart disease Mother   .  Diabetes Mother     Social History Social History   Tobacco Use  . Smoking status: Never Smoker  . Smokeless tobacco: Never Used  Substance Use Topics  . Alcohol use: Yes    Alcohol/week: 4.2 oz    Types: 7 Cans of beer per week  . Drug use: No     Allergies   Patient has no known allergies.   Review of Systems Review of Systems  Gastrointestinal: Positive for abdominal pain.  All other systems reviewed and are negative.    Physical Exam Updated Vital Signs BP 125/69   Pulse 89   Temp 98.5 F (36.9 C) (Oral)   Resp 18   Ht 5\' 6"  (1.676 m)   Wt 63.5  kg (140 lb)   SpO2 99%   BMI 22.60 kg/m   Physical Exam  Constitutional:  Chronically ill   HENT:  Head: Normocephalic.  MM dry   Cardiovascular: Normal rate, regular rhythm and normal heart sounds.  Pulmonary/Chest: Effort normal and breath sounds normal.  Abdominal: Normal appearance.  + epigastric tenderness, no rebound   Neurological: He is alert.  Skin: Skin is warm. Capillary refill takes less than 2 seconds.  Psychiatric: He has a normal mood and affect.  Nursing note and vitals reviewed.    ED Treatments / Results  Labs (all labs ordered are listed, but only abnormal results are displayed) Labs Reviewed  CBC WITH DIFFERENTIAL/PLATELET - Abnormal; Notable for the following components:      Result Value   RBC 4.03 (*)    Hemoglobin 12.7 (*)    HCT 37.2 (*)    All other components within normal limits  COMPREHENSIVE METABOLIC PANEL - Abnormal; Notable for the following components:   Glucose, Bld 124 (*)    BUN 23 (*)    Creatinine, Ser 1.48 (*)    AST 51 (*)    Alkaline Phosphatase 232 (*)    GFR calc non Af Amer 39 (*)    GFR calc Af Amer 45 (*)    All other components within normal limits  LIPASE, BLOOD - Abnormal; Notable for the following components:   Lipase 94 (*)    All other components within normal limits  URINALYSIS, ROUTINE W REFLEX MICROSCOPIC  I-STAT TROPONIN, ED    EKG  EKG Interpretation  Date/Time:  Saturday September 25 2017 17:12:19 EST Ventricular Rate:  83 PR Interval:    QRS Duration: 80 QT Interval:  382 QTC Calculation: 449 R Axis:   47 Text Interpretation:  Sinus rhythm Abnormal R-wave progression, early transition No significant change since last tracing Confirmed by Wandra Arthurs [phone]) on 09/25/2017 5:14:48 PM       Radiology Ct Abdomen Pelvis Wo Contrast  Result Date: 09/24/2017 CLINICAL DATA:  Weight loss, mid abdominal pain. EXAM: CT CHEST, ABDOMEN AND PELVIS WITHOUT CONTRAST TECHNIQUE: Multidetector CT imaging of  the chest, abdomen and pelvis was performed following the standard protocol without IV contrast. COMPARISON:  CT scan of May 27, 2014. FINDINGS: CT CHEST FINDINGS Cardiovascular: Atherosclerosis of thoracic aorta is noted without aneurysm formation. Normal cardiac size. No pericardial effusion. Mild coronary artery calcifications are noted. Mediastinum/Nodes: No enlarged mediastinal, hilar, or axillary lymph nodes. Thyroid gland, trachea, and esophagus demonstrate no significant findings. Lungs/Pleura: No pneumothorax or pleural effusion is noted. No acute pulmonary disease is noted. Musculoskeletal: No chest wall mass or suspicious bone lesions identified. CT ABDOMEN PELVIS FINDINGS Hepatobiliary: Multiple rounded low densities are noted throughout hepatic parenchyma consistent  with metastatic disease. No gallstones are noted. Pancreas: Possible 3.5 cm solid mass is seen in pancreatic head concerning for malignancy. Further evaluation with MRI is recommended. No ductal dilatation is noted. Spleen: Normal in size without focal abnormality. Adrenals/Urinary Tract: Adrenal glands are unremarkable. Stable right renal cysts are noted. No hydronephrosis or renal obstruction is noted. No renal or ureteral calculi are noted. Urinary bladder is unremarkable. Stomach/Bowel: Stomach is within normal limits. Appendix appears normal. No evidence of bowel wall thickening, distention, or inflammatory changes. Stool is noted throughout the colon. Vascular/Lymphatic: Aortic atherosclerosis. No enlarged abdominal or pelvic lymph nodes. Reproductive: Moderate prostatic enlargement is noted. Other: No abdominal wall hernia or abnormality. No abdominopelvic ascites. Musculoskeletal: No acute or significant osseous findings. IMPRESSION: Multiple rounded low densities are noted throughout hepatic parenchyma consistent with metastatic disease. Possible 3.5 cm solid mass seen in pancreatic head concerning for malignancy. MRI is  recommended for further evaluation. Atherosclerosis of thoracic and abdominal aorta is noted without aneurysm formation. Stable right renal cysts. Moderate prostatic enlargement. These results will be called to the ordering clinician or representative by the Radiologist Assistant, and communication documented in the PACS or zVision Dashboard. Electronically Signed   By: Marijo Conception, M.D.   On: 09/24/2017 14:30   Ct Chest Wo Contrast  Result Date: 09/24/2017 CLINICAL DATA:  Weight loss, mid abdominal pain. EXAM: CT CHEST, ABDOMEN AND PELVIS WITHOUT CONTRAST TECHNIQUE: Multidetector CT imaging of the chest, abdomen and pelvis was performed following the standard protocol without IV contrast. COMPARISON:  CT scan of May 27, 2014. FINDINGS: CT CHEST FINDINGS Cardiovascular: Atherosclerosis of thoracic aorta is noted without aneurysm formation. Normal cardiac size. No pericardial effusion. Mild coronary artery calcifications are noted. Mediastinum/Nodes: No enlarged mediastinal, hilar, or axillary lymph nodes. Thyroid gland, trachea, and esophagus demonstrate no significant findings. Lungs/Pleura: No pneumothorax or pleural effusion is noted. No acute pulmonary disease is noted. Musculoskeletal: No chest wall mass or suspicious bone lesions identified. CT ABDOMEN PELVIS FINDINGS Hepatobiliary: Multiple rounded low densities are noted throughout hepatic parenchyma consistent with metastatic disease. No gallstones are noted. Pancreas: Possible 3.5 cm solid mass is seen in pancreatic head concerning for malignancy. Further evaluation with MRI is recommended. No ductal dilatation is noted. Spleen: Normal in size without focal abnormality. Adrenals/Urinary Tract: Adrenal glands are unremarkable. Stable right renal cysts are noted. No hydronephrosis or renal obstruction is noted. No renal or ureteral calculi are noted. Urinary bladder is unremarkable. Stomach/Bowel: Stomach is within normal limits. Appendix appears  normal. No evidence of bowel wall thickening, distention, or inflammatory changes. Stool is noted throughout the colon. Vascular/Lymphatic: Aortic atherosclerosis. No enlarged abdominal or pelvic lymph nodes. Reproductive: Moderate prostatic enlargement is noted. Other: No abdominal wall hernia or abnormality. No abdominopelvic ascites. Musculoskeletal: No acute or significant osseous findings. IMPRESSION: Multiple rounded low densities are noted throughout hepatic parenchyma consistent with metastatic disease. Possible 3.5 cm solid mass seen in pancreatic head concerning for malignancy. MRI is recommended for further evaluation. Atherosclerosis of thoracic and abdominal aorta is noted without aneurysm formation. Stable right renal cysts. Moderate prostatic enlargement. These results will be called to the ordering clinician or representative by the Radiologist Assistant, and communication documented in the PACS or zVision Dashboard. Electronically Signed   By: Marijo Conception, M.D.   On: 09/24/2017 14:30    Procedures Procedures (including critical care time)  Medications Ordered in ED Medications  sodium chloride 0.9 % bolus 500 mL (0 mLs Intravenous Stopped 09/25/17 1746)  morphine 2 MG/ML injection 2 mg (2 mg Intravenous Given 09/25/17 1639)  ondansetron (ZOFRAN) injection 4 mg (4 mg Intravenous Given 09/25/17 1639)     Initial Impression / Assessment and Plan / ED Course  I have reviewed the triage vital signs and the nursing notes.  Pertinent labs & imaging results that were available during my care of the patient were reviewed by me and considered in my medical decision making (see chart for details).     Rocket Gunderson is a 83 y.o. male here with epigastric pain, nausea, poor appetite. CT yesterday showed liver mets with pancreatic mass. Concerned for possible pancreatic cancer causing his symptoms. This is likely going to be poor prognosis. Patient lives at home with family and has good  functional status. I told him the diagnosis and prognosis and patient wants to get labs, MRI and wants to pursue reasonable treatment options.   6:48 PM Labs showed mild AKI, lipase 94. LFT slightly elevated. Will admit for IVF, pain meds, MRI ab/pel and further treatment options.   Final Clinical Impressions(s) / ED Diagnoses   Final diagnoses:  Pancreatic mass    ED Discharge Orders    None       Drenda Freeze, MD 09/25/17 [phone]

## 2017-09-25 NOTE — ED Notes (Signed)
IN MRI

## 2017-09-26 ENCOUNTER — Encounter (HOSPITAL_COMMUNITY): Payer: Self-pay | Admitting: Radiology

## 2017-09-26 ENCOUNTER — Other Ambulatory Visit: Payer: Self-pay

## 2017-09-26 DIAGNOSIS — C787 Secondary malignant neoplasm of liver and intrahepatic bile duct: Secondary | ICD-10-CM

## 2017-09-26 DIAGNOSIS — K869 Disease of pancreas, unspecified: Secondary | ICD-10-CM | POA: Diagnosis not present

## 2017-09-26 DIAGNOSIS — K769 Liver disease, unspecified: Secondary | ICD-10-CM | POA: Diagnosis not present

## 2017-09-26 DIAGNOSIS — E860 Dehydration: Secondary | ICD-10-CM | POA: Diagnosis not present

## 2017-09-26 LAB — BASIC METABOLIC PANEL
Anion gap: 8 (ref 5–15)
BUN: 19 mg/dL (ref 6–20)
CO2: 24 mmol/L (ref 22–32)
CREATININE: 1.23 mg/dL (ref 0.61–1.24)
Calcium: 8.6 mg/dL — ABNORMAL LOW (ref 8.9–10.3)
Chloride: 108 mmol/L (ref 101–111)
GFR calc Af Amer: 57 mL/min — ABNORMAL LOW (ref >60)
GFR, EST NON AFRICAN AMERICAN: 49 mL/min — AB (ref >60)
Glucose, Bld: 101 mg/dL — ABNORMAL HIGH (ref 65–99)
POTASSIUM: 4.5 mmol/L (ref 3.5–5.1)
SODIUM: 140 mmol/L (ref 135–145)

## 2017-09-26 LAB — URINALYSIS, ROUTINE W REFLEX MICROSCOPIC
Bilirubin Urine: NEGATIVE
GLUCOSE, UA: NEGATIVE mg/dL
Hgb urine dipstick: NEGATIVE
Ketones, ur: 20 mg/dL — AB
NITRITE: NEGATIVE
PROTEIN: NEGATIVE mg/dL
SPECIFIC GRAVITY, URINE: 1.02 (ref 1.005–1.030)
pH: 5 (ref 5.0–8.0)

## 2017-09-26 LAB — CREATININE, SERUM
Creatinine, Ser: 1.29 mg/dL — ABNORMAL HIGH (ref 0.61–1.24)
GFR calc Af Amer: 53 mL/min — ABNORMAL LOW (ref >60)
GFR calc non Af Amer: 46 mL/min — ABNORMAL LOW (ref >60)

## 2017-09-26 LAB — CBC
HCT: 35.2 % — ABNORMAL LOW (ref 39.0–52.0)
Hemoglobin: 11.5 g/dL — ABNORMAL LOW (ref 13.0–17.0)
MCH: 30.4 pg (ref 26.0–34.0)
MCHC: 32.7 g/dL (ref 30.0–36.0)
MCV: 93.1 fL (ref 78.0–100.0)
Platelets: 231 10*3/uL (ref 150–400)
RBC: 3.78 MIL/uL — ABNORMAL LOW (ref 4.22–5.81)
RDW: 13 % (ref 11.5–15.5)
WBC: 8 10*3/uL (ref 4.0–10.5)

## 2017-09-26 MED ORDER — HEPARIN SODIUM (PORCINE) 5000 UNIT/ML IJ SOLN
5000.0000 [IU] | Freq: Three times a day (TID) | INTRAMUSCULAR | Status: DC
  Administered 2017-09-26: 5000 [IU] via SUBCUTANEOUS
  Filled 2017-09-26: qty 1

## 2017-09-26 NOTE — Care Management Obs Status (Signed)
[place] NOTIFICATION   Patient Details  Name: Jerry Berry MRN: [id number] Date of Birth: September 17, 1922   Medicare Observation Status Notification Given:  Yes    Erenest Rasher, RN 09/26/2017, 6:50 PM

## 2017-09-26 NOTE — Evaluation (Signed)
Physical Therapy Evaluation Patient Details Name: Jerry Berry MRN: [id number] DOB: Jun 18, 1922 Today's Date: 09/26/2017   History of Present Illness  Admitted with abdominal pain, weight loss and poor appetite. Imaging studies revealed pancreatic mass with likely metastatic disease to the liver. For biopsy 12/24  Clinical Impression  Pt admitted with above diagnosis. Pt currently with functional limitations due to the deficits listed below (see PT Problem List).  Pt will benefit from skilled PT to increase their independence and safety with mobility to allow discharge to the venue listed below.   Pt able to amb 120' with RW and min to min/guard assist for balance and safety; Pt is independent at baseline, using cane occasionally for gait stability, lives with dtr;  Do not recommend f/u at this time, however will continue to follow and update as needed depending on pt course of tx     Follow Up Recommendations No PT follow up;Supervision - Intermittent    Equipment Recommendations  None recommended by PT    Recommendations for Other Services       Precautions / Restrictions Precautions Precautions: Fall      Mobility  Bed Mobility Overal bed mobility: Needs Assistance Bed Mobility: Supine to Sit     Supine to sit: Supervision     General bed mobility comments: for safety only, no physical assist, slightly incr time to complete  Transfers Overall transfer level: Needs assistance Equipment used: Rolling walker (2 wheeled) Transfers: Sit to/from Stand Sit to Stand: Min guard;Min assist         General transfer comment: light assist to rise and stabilize, cues for hand placement, assist to control descent  Ambulation/Gait Ambulation/Gait assistance: Min guard;Min assist Ambulation Distance (Feet): 120 Feet Assistive device: Rolling walker (2 wheeled) Gait Pattern/deviations: Step-through pattern;Decreased stride length;Narrow base of support     General Gait  Details: cues for step length, RW position; slightly unsteady initially, min to min/guard for balance and safety; balance improved with RW support, gait velocity incr with distance  Stairs            Wheelchair Mobility    Modified Rankin (Stroke Patients Only)       Balance Overall balance assessment: Needs assistance Sitting-balance support: No upper extremity supported;Feet unsupported Sitting balance-Leahy Scale: Good     Standing balance support: During functional activity;Bilateral upper extremity supported Standing balance-Leahy Scale: Poor Standing balance comment: reliant on UEs for dynamic activity                             Pertinent Vitals/Pain Pain Assessment: No/denies pain    Home Living Family/patient expects to be discharged to:: Private residence Living Arrangements: Children(dtr) Available Help at Discharge: Available PRN/intermittently Type of Home: House Home Access: Stairs to enter;Ramped entrance(was not using ramp prior to this adm )   Entrance Stairs-Number of Steps: 5 Home Layout: Multi-level;Able to live on main level with bedroom/bathroom Home Equipment: Kasandra Knudsen - single point;Walker - 2 wheels Additional Comments: retired Hydrologist Level of Independence: Independent with assistive device(s);Independent         Comments: pt reports independent bathing and dressing, uses cane/walker "occasionally"     Hand Dominance        Extremity/Trunk Assessment   Upper Extremity Assessment Upper Extremity Assessment: Generalized weakness;Defer to OT evaluation    Lower Extremity Assessment Lower Extremity Assessment: Generalized weakness;RLE deficits/detail RLE Deficits / Details: pt AROM WFL bil LEs,  appears to have diffuse atrophy although strength at least 3 to 3+/5 bil       Communication   Communication: No difficulties  Cognition Arousal/Alertness: Awake/alert Behavior During Therapy: WFL for tasks  assessed/performed Overall Cognitive Status: Within Functional Limits for tasks assessed                                        General Comments      Exercises     Assessment/Plan    PT Assessment Patient needs continued PT services  PT Problem List Decreased mobility;Decreased balance;Decreased knowledge of use of DME;Decreased activity tolerance       PT Treatment Interventions DME instruction;Gait training;Functional mobility training;Therapeutic activities;Therapeutic exercise;Patient/family education;Balance training    PT Goals (Current goals can be found in the Care Plan section)  Acute Rehab PT Goals Patient Stated Goal: none stated PT Goal Formulation: With patient Potential to Achieve Goals: Good    Frequency Min 3X/week   Barriers to discharge        Co-evaluation               AM-PAC PT "6 Clicks" Daily Activity  Outcome Measure Difficulty turning over in bed (including adjusting bedclothes, sheets and blankets)?: None Difficulty moving from lying on back to sitting on the side of the bed? : None Difficulty sitting down on and standing up from a chair with arms (e.g., wheelchair, bedside commode, etc,.)?: Unable Help needed moving to and from a bed to chair (including a wheelchair)?: A Little Help needed walking in hospital room?: A Little Help needed climbing 3-5 steps with a railing? : A Little 6 Click Score: 18    End of Session Equipment Utilized During Treatment: Gait belt Activity Tolerance: Patient tolerated treatment well Patient left: in chair;with call bell/phone within reach Nurse Communication: Mobility status PT Visit Diagnosis: Difficulty in walking, not elsewhere classified (R26.2)    Time: 9924-2683 PT Time Calculation (min) (ACUTE ONLY): 21 min   Charges:   PT Evaluation $PT Eval Low Complexity: 1 Low     PT G Codes:   PT G-Codes **NOT FOR INPATIENT CLASS** Functional Assessment Tool Used: AM-PAC 6 Clicks  Basic Mobility;Clinical judgement Functional Limitation: Mobility: Walking and moving around Mobility: Walking and Moving Around Current Status (M1962): At least 20 percent but less than 40 percent impaired, limited or restricted Mobility: Walking and Moving Around Goal Status [phone]): At least 1 percent but less than 20 percent impaired, limited or restricted    Kenyon Ana, PT Pager: [phone] 09/26/2017   [facility] 09/26/2017, 3:36 PM

## 2017-09-26 NOTE — Progress Notes (Signed)
Chief Complaint: Patient was seen in consultation today for biopsy of liver lesion at the request of  Dr. Dana Allan  Referring Physician(s): Dr. Dana Allan  Supervising Physician: Aletta Edouard  Patient Status: [facility] - In-pt  History of Present Illness: Jerry Berry is a 83 y.o. male admitted with a couple months of abd pain, wt loss, and anorexia. He is found to have a pancreatic mass and liver lesions c/w metastatic process. IR is asked to biopsy liver lesion for definitive diagnosis PMX, meds, labs, imaging reviewed. Niece at bedside.  Past Medical History:  Diagnosis Date  . Abdominal desmoid tumor   . Arthritis    "aching in shoulders, center of lower back" (05/29/2016)  . Bronchitis   . Chronic kidney disease    /notes 05/29/2016  . History of lower GI bleeding   . Hypertension   . Prostate pain   . Ulcerative colitis [facility])     Past Surgical History:  Procedure Laterality Date  . COLONOSCOPY N/A 05/30/2014   Procedure: COLONOSCOPY;  Surgeon: Milus Banister, MD;  Location: [place];  Service: Endoscopy;  Laterality: N/A;  . TOOTH EXTRACTION      Allergies: Patient has no known allergies.  Medications:  Current Facility-Administered Medications:  .  0.9 %  sodium chloride infusion, , Intravenous, Continuous, Hosie Poisson, MD, Last Rate: 100 mL/hr at 09/26/17 1104 .  acetaminophen (TYLENOL) tablet 650 mg, 650 mg, Oral, Q6H PRN **OR** acetaminophen (TYLENOL) suppository 650 mg, 650 mg, Rectal, Q6H PRN, Hosie Poisson, MD .  feeding supplement (BOOST / RESOURCE BREEZE) liquid 1 Container, 1 Container, Oral, TID BM, Hosie Poisson, MD, 1 Container at 09/26/17 1100 .  latanoprost (XALATAN) 0.005 % ophthalmic solution 1 drop, 1 drop, Both Eyes, QHS, Hosie Poisson, MD, 1 drop at 09/25/17 2341 .  morphine 2 MG/ML injection 1-2 mg, 1-2 mg, Intravenous, Q4H PRN, Hosie Poisson, MD .  ondansetron (ZOFRAN) tablet 4 mg, 4 mg, Oral, Q6H PRN **OR**  ondansetron (ZOFRAN) injection 4 mg, 4 mg, Intravenous, Q6H PRN, Hosie Poisson, MD .  pantoprazole (PROTONIX) EC tablet 40 mg, 40 mg, Oral, Q0600, Hosie Poisson, MD .  tamsulosin (FLOMAX) capsule 0.4 mg, 0.4 mg, Oral, QHS, Hosie Poisson, MD, 0.4 mg at 09/25/17 2338 .  traMADol (ULTRAM) tablet 50 mg, 50 mg, Oral, Q12H PRN, Hosie Poisson, MD    Family History  Problem Relation Age of Onset  . Heart disease Mother   . Diabetes Mother     Social History   Socioeconomic History  . Marital status: Widowed    Spouse name: None  . Number of children: None  . Years of education: None  . Highest education level: None  Social Needs  . Financial resource strain: None  . Food insecurity - worry: None  . Food insecurity - inability: None  . Transportation needs - medical: None  . Transportation needs - non-medical: None  Occupational History  . None  Tobacco Use  . Smoking status: Never Smoker  . Smokeless tobacco: Never Used  Substance and Sexual Activity  . Alcohol use: Yes    Alcohol/week: 4.2 oz    Types: 7 Cans of beer per week  . Drug use: No  . Sexual activity: No  Other Topics Concern  . None  Social History Narrative  . None     Review of Systems: A 12 point ROS discussed and pertinent positives are indicated in the HPI above.  All other systems are negative.  Review of  Systems  Vital Signs: BP 107/64 (BP Location: Right Arm)   Pulse 80   Temp 98 F (36.7 C) (Oral)   Resp 16   Ht 5\' 6"  (1.676 m)   Wt 140 lb (63.5 kg)   SpO2 96%   BMI 22.60 kg/m   Physical Exam  Constitutional: He is oriented to person, place, and time. He appears well-developed.  Non-toxic appearance. No distress.  HENT:  Head: Normocephalic.  Mouth/Throat: Oropharynx is clear and moist.  Neck: Normal range of motion. No JVD present. No tracheal deviation present.  Cardiovascular: Normal rate, regular rhythm and normal heart sounds.  Pulmonary/Chest: Effort normal and breath sounds  normal. No respiratory distress.  Abdominal: Soft. He exhibits no distension. There is no tenderness.  Neurological: He is alert and oriented to person, place, and time.  Skin: Skin is warm and dry.  Psychiatric: He has a normal mood and affect.    Imaging: Ct Abdomen Pelvis Wo Contrast  Result Date: 09/24/2017 CLINICAL DATA:  Weight loss, mid abdominal pain. EXAM: CT CHEST, ABDOMEN AND PELVIS WITHOUT CONTRAST TECHNIQUE: Multidetector CT imaging of the chest, abdomen and pelvis was performed following the standard protocol without IV contrast. COMPARISON:  CT scan of May 27, 2014. FINDINGS: CT CHEST FINDINGS Cardiovascular: Atherosclerosis of thoracic aorta is noted without aneurysm formation. Normal cardiac size. No pericardial effusion. Mild coronary artery calcifications are noted. Mediastinum/Nodes: No enlarged mediastinal, hilar, or axillary lymph nodes. Thyroid gland, trachea, and esophagus demonstrate no significant findings. Lungs/Pleura: No pneumothorax or pleural effusion is noted. No acute pulmonary disease is noted. Musculoskeletal: No chest wall mass or suspicious bone lesions identified. CT ABDOMEN PELVIS FINDINGS Hepatobiliary: Multiple rounded low densities are noted throughout hepatic parenchyma consistent with metastatic disease. No gallstones are noted. Pancreas: Possible 3.5 cm solid mass is seen in pancreatic head concerning for malignancy. Further evaluation with MRI is recommended. No ductal dilatation is noted. Spleen: Normal in size without focal abnormality. Adrenals/Urinary Tract: Adrenal glands are unremarkable. Stable right renal cysts are noted. No hydronephrosis or renal obstruction is noted. No renal or ureteral calculi are noted. Urinary bladder is unremarkable. Stomach/Bowel: Stomach is within normal limits. Appendix appears normal. No evidence of bowel wall thickening, distention, or inflammatory changes. Stool is noted throughout the colon. Vascular/Lymphatic: Aortic  atherosclerosis. No enlarged abdominal or pelvic lymph nodes. Reproductive: Moderate prostatic enlargement is noted. Other: No abdominal wall hernia or abnormality. No abdominopelvic ascites. Musculoskeletal: No acute or significant osseous findings. IMPRESSION: Multiple rounded low densities are noted throughout hepatic parenchyma consistent with metastatic disease. Possible 3.5 cm solid mass seen in pancreatic head concerning for malignancy. MRI is recommended for further evaluation. Atherosclerosis of thoracic and abdominal aorta is noted without aneurysm formation. Stable right renal cysts. Moderate prostatic enlargement. These results will be called to the ordering clinician or representative by the Radiologist Assistant, and communication documented in the PACS or zVision Dashboard. Electronically Signed   By: Marijo Conception, M.D.   On: 09/24/2017 14:30   Ct Chest Wo Contrast  Result Date: 09/24/2017 CLINICAL DATA:  Weight loss, mid abdominal pain. EXAM: CT CHEST, ABDOMEN AND PELVIS WITHOUT CONTRAST TECHNIQUE: Multidetector CT imaging of the chest, abdomen and pelvis was performed following the standard protocol without IV contrast. COMPARISON:  CT scan of May 27, 2014. FINDINGS: CT CHEST FINDINGS Cardiovascular: Atherosclerosis of thoracic aorta is noted without aneurysm formation. Normal cardiac size. No pericardial effusion. Mild coronary artery calcifications are noted. Mediastinum/Nodes: No enlarged mediastinal, hilar, or axillary lymph  nodes. Thyroid gland, trachea, and esophagus demonstrate no significant findings. Lungs/Pleura: No pneumothorax or pleural effusion is noted. No acute pulmonary disease is noted. Musculoskeletal: No chest wall mass or suspicious bone lesions identified. CT ABDOMEN PELVIS FINDINGS Hepatobiliary: Multiple rounded low densities are noted throughout hepatic parenchyma consistent with metastatic disease. No gallstones are noted. Pancreas: Possible 3.5 cm solid mass is  seen in pancreatic head concerning for malignancy. Further evaluation with MRI is recommended. No ductal dilatation is noted. Spleen: Normal in size without focal abnormality. Adrenals/Urinary Tract: Adrenal glands are unremarkable. Stable right renal cysts are noted. No hydronephrosis or renal obstruction is noted. No renal or ureteral calculi are noted. Urinary bladder is unremarkable. Stomach/Bowel: Stomach is within normal limits. Appendix appears normal. No evidence of bowel wall thickening, distention, or inflammatory changes. Stool is noted throughout the colon. Vascular/Lymphatic: Aortic atherosclerosis. No enlarged abdominal or pelvic lymph nodes. Reproductive: Moderate prostatic enlargement is noted. Other: No abdominal wall hernia or abnormality. No abdominopelvic ascites. Musculoskeletal: No acute or significant osseous findings. IMPRESSION: Multiple rounded low densities are noted throughout hepatic parenchyma consistent with metastatic disease. Possible 3.5 cm solid mass seen in pancreatic head concerning for malignancy. MRI is recommended for further evaluation. Atherosclerosis of thoracic and abdominal aorta is noted without aneurysm formation. Stable right renal cysts. Moderate prostatic enlargement. These results will be called to the ordering clinician or representative by the Radiologist Assistant, and communication documented in the PACS or zVision Dashboard. Electronically Signed   By: Marijo Conception, M.D.   On: 09/24/2017 14:30   Mr Abdomen W Wo Contrast  Result Date: 09/25/2017 CLINICAL DATA:  Weight loss and mid abdominal pain. Possible pancreatic mass. EXAM: MRI ABDOMEN WITHOUT AND WITH CONTRAST TECHNIQUE: Multiplanar multisequence MR imaging of the abdomen was performed both before and after the administration of intravenous contrast. CONTRAST:  90mL MULTIHANCE GADOBENATE DIMEGLUMINE 529 MG/ML IV SOLN COMPARISON:  09/24/2017 CT scan FINDINGS: Despite efforts by the technologist and  patient, motion artifact is present on today's exam and could not be eliminated. This reduces exam sensitivity and specificity. Lower chest: Mild dependent atelectasis at the lung bases. Hepatobiliary: Innumerable enhancing metastatic lesions are present throughout all segments of the liver. Any index lesion posteriorly in the right hepatic lobe measures 3.1 by 3.8 cm on image 19/3. No biliary dilatation. Pancreas: A pancreatic mass is present with following characteristics. : Size: 2.9 by 2.4 by 2.8 cm Location: Upper pancreatic head, partially exophytic Characterization: Centrally necrotic and solid Enhancement: Hypoenhancing Other Characteristics: None Local extent of mass: Mass abuts the duodenal bulb and descending duodenum, invasion of the structures is not entirely excluded. Vascular Involvement: The mass abuts the gastroduodenal artery but does not appear to of directly abut the celiac trunk, SMA, common hepatic artery, or proper hepatic artery. No appreciable about 9 or invasion of the portal vein, SMV, or splenic vein. Variant hepatic artery anatomy: Conventional (no variant), arising from the celiac trunk ultimately. Bile Duct Involvement: Absent Variant biliary anatomy: No Adjacent Nodes: 1.9 cm in short axis porta hepatis lymph node just below the portal vein, likely malignant. Omental/Peritoneal Disease: Not observed Distant Metastases: Yes (liver). No dilatation of the dorsal pancreatic duct. Spleen:  Unremarkable Adrenals/Urinary Tract: Adrenal glands unremarkable. Complex Bosniak category 25F cyst of the right kidney lower pole without appreciable enhancement but given the degree of calcification in septation this likely warrants surveillance. Stomach/Bowel: As noted above, the pancreatic mass abuts the duodenal bulb and descending duodenum. There also appears to be  a small incidental lipoma in the descending duodenum near the ampulla. Vascular/Lymphatic:  Aortoiliac atherosclerotic vascular disease.  Other:  No supplemental non-categorized findings. Musculoskeletal: Lumbar spondylosis and degenerative disc disease. IMPRESSION: 1. Pancreatic head mass with extensive metastatic disease to the liver. Overall appearance favors pancreatic ductal adenocarcinoma with extensive metastatic disease to the liver and associated pathologic adenopathy in the porta hepatis. Other characteristics are as described above; please note that CT abdomen using pancreatic protocol provides for better spatial resolution and is typically the primary exam performed in the setting of new diagnosis of pancreatic cancer. This should likely be coordinated with the oncology service and does not need to be performed as an emergency examination. 2. Despite efforts by the technologist and patient, motion artifact is present on today's exam and could not be eliminated. This reduces exam sensitivity and specificity. 3.  Aortic Atherosclerosis (ICD10-I70.0). 4. Bosniak category 43F cyst of the right kidney lower pole. Typical guidelines would call for follow up imaging in 6 months time; it may be that the patient's surveillance imaging related to the expected pancreatic malignancy will be adequate to follow up this complex but probably benign cystic lesion. Electronically Signed   By: Van Clines M.D.   On: 09/25/2017 21:18   Mr 3d Recon At Scanner  Result Date: 09/25/2017 CLINICAL DATA:  Weight loss and mid abdominal pain. Possible pancreatic mass. EXAM: MRI ABDOMEN WITHOUT AND WITH CONTRAST TECHNIQUE: Multiplanar multisequence MR imaging of the abdomen was performed both before and after the administration of intravenous contrast. CONTRAST:  77mL MULTIHANCE GADOBENATE DIMEGLUMINE 529 MG/ML IV SOLN COMPARISON:  09/24/2017 CT scan FINDINGS: Despite efforts by the technologist and patient, motion artifact is present on today's exam and could not be eliminated. This reduces exam sensitivity and specificity. Lower chest: Mild dependent  atelectasis at the lung bases. Hepatobiliary: Innumerable enhancing metastatic lesions are present throughout all segments of the liver. Any index lesion posteriorly in the right hepatic lobe measures 3.1 by 3.8 cm on image 19/3. No biliary dilatation. Pancreas: A pancreatic mass is present with following characteristics. : Size: 2.9 by 2.4 by 2.8 cm Location: Upper pancreatic head, partially exophytic Characterization: Centrally necrotic and solid Enhancement: Hypoenhancing Other Characteristics: None Local extent of mass: Mass abuts the duodenal bulb and descending duodenum, invasion of the structures is not entirely excluded. Vascular Involvement: The mass abuts the gastroduodenal artery but does not appear to of directly abut the celiac trunk, SMA, common hepatic artery, or proper hepatic artery. No appreciable about 9 or invasion of the portal vein, SMV, or splenic vein. Variant hepatic artery anatomy: Conventional (no variant), arising from the celiac trunk ultimately. Bile Duct Involvement: Absent Variant biliary anatomy: No Adjacent Nodes: 1.9 cm in short axis porta hepatis lymph node just below the portal vein, likely malignant. Omental/Peritoneal Disease: Not observed Distant Metastases: Yes (liver). No dilatation of the dorsal pancreatic duct. Spleen:  Unremarkable Adrenals/Urinary Tract: Adrenal glands unremarkable. Complex Bosniak category 43F cyst of the right kidney lower pole without appreciable enhancement but given the degree of calcification in septation this likely warrants surveillance. Stomach/Bowel: As noted above, the pancreatic mass abuts the duodenal bulb and descending duodenum. There also appears to be a small incidental lipoma in the descending duodenum near the ampulla. Vascular/Lymphatic:  Aortoiliac atherosclerotic vascular disease. Other:  No supplemental non-categorized findings. Musculoskeletal: Lumbar spondylosis and degenerative disc disease. IMPRESSION: 1. Pancreatic head mass  with extensive metastatic disease to the liver. Overall appearance favors pancreatic ductal adenocarcinoma with extensive metastatic  disease to the liver and associated pathologic adenopathy in the porta hepatis. Other characteristics are as described above; please note that CT abdomen using pancreatic protocol provides for better spatial resolution and is typically the primary exam performed in the setting of new diagnosis of pancreatic cancer. This should likely be coordinated with the oncology service and does not need to be performed as an emergency examination. 2. Despite efforts by the technologist and patient, motion artifact is present on today's exam and could not be eliminated. This reduces exam sensitivity and specificity. 3.  Aortic Atherosclerosis (ICD10-I70.0). 4. Bosniak category 62F cyst of the right kidney lower pole. Typical guidelines would call for follow up imaging in 6 months time; it may be that the patient's surveillance imaging related to the expected pancreatic malignancy will be adequate to follow up this complex but probably benign cystic lesion. Electronically Signed   By: Van Clines M.D.   On: 09/25/2017 21:18    Labs:  CBC: Recent Labs    09/25/17 1628  WBC 8.7  HGB 12.7*  HCT 37.2*  PLT 264    COAGS: No results for input(s): INR, APTT in the last 8760 hours.  BMP: Recent Labs    09/25/17 1628 09/26/17 0415  NA 141 140  K 4.8 4.5  CL 105 108  CO2 26 24  GLUCOSE 124* 101*  BUN 23* 19  CALCIUM 9.3 8.6*  CREATININE 1.48* 1.23  GFRNONAA 39* 49*  GFRAA 45* 57*    LIVER FUNCTION TESTS: Recent Labs    09/25/17 1628  BILITOT 1.0  AST 51*  ALT 30  ALKPHOS 232*  PROT 7.7  ALBUMIN 3.7    TUMOR MARKERS: No results for input(s): AFPTM, CEA, CA199, CHROMGRNA in the last 8760 hours.  Assessment and Plan: Pancreatic mass with liver lesions. Plan for US guided biopsy liver lesion tomorrow. Check coags, hold Lovenox NPO p MN Risks and  benefits discussed with the patient including, but not limited to bleeding, infection, damage to adjacent structures or low yield requiring additional tests. All of the patient's questions were answered, patient is agreeable to proceed. Consent signed and in chart.    Thank you for this interesting consult.  I greatly enjoyed meeting Jerry Berry and look forward to participating in their care.  A copy of this report was sent to the requesting provider on this date.  Electronically Signed: Ascencion Dike, PA-C 09/26/2017, 11:26 AM   I spent a total of 20 minutes in face to face in clinical consultation, greater than 50% of which was counseling/coordinating care for liver biopsy

## 2017-09-26 NOTE — Progress Notes (Signed)
Extensive conversation with patient and family at bedside. Pt states he has discussed biopsy with family and has decided that he would prefer to wait on having biopsy performed until after he has had to opportunity to discuss procedure with his PCP.

## 2017-09-26 NOTE — Progress Notes (Signed)
PROGRESS NOTE    Jerry Berry  PPJ:[id number] DOB: September 14, 1922 DOA: 09/25/2017 PCP: Foye Spurling, MD  Outpatient Specialists:     Brief Narrative: Admitted with abdominal pain, weight loss and poor appetite. Imaging studies revealed pancreatic mass with likely metastatic disease to the liver. For biopsy in am. Patient seen alongside patient's Niece and Nephew.   Assessment & Plan:   Active Problems:   Dehydration  Assessment and Plan: - Pancreatic mass with likely metastasis to the liver - Weight Loss - Anorexia   DVT prophylaxis: [place] Heparin Code Status: Full Family Communication: Niece and Nephew Disposition Plan: Home in am after biopsy   Consultants:   Oncology (Dr. Alvy Bimler)  Procedures:   None  Antimicrobials:   None    Subjective: No new compalints  Objective: Vitals:   09/25/17 1927 09/25/17 1930 09/25/17 2202 09/26/17 0613  BP:  113/64 123/69 107/64  Pulse:  87 84 80  Resp:  14 16 16   Temp:   98 F (36.7 C) 98 F (36.7 C)  TempSrc:   Oral Oral  SpO2: 98% 97% 99% 96%  Weight:      Height:        Intake/Output Summary (Last 24 hours) at 09/26/2017 1211 Last data filed at 09/26/2017 0845 Gross per 24 hour  Intake 600 ml  Output 300 ml  Net 300 ml   Filed Weights   09/25/17 1548  Weight: 63.5 kg (140 lb)    Examination:  General exam: Appears calm and comfortable  HEENT - No Jaundice Respiratory system: Clear to auscultation. Respiratory effort normal. Cardiovascular system: S1 & S2 heard, RRR. No JVD, murmurs, rubs, gallops or clicks. No pedal edema. Gastrointestinal system: Abdomen is nondistended, soft and nontender. No organomegaly or masses felt. Normal bowel sounds heard. Central nervous system: Alert and oriented. No focal neurological deficits. Extremities: Symmetric 5 x 5 power. No leg edema. Psychiatry: Judgement and insight appear normal. Mood & affect appropriate.     Data Reviewed: I have personally reviewed  following labs and imaging studies  CBC: Recent Labs  Lab 09/25/17 1628  WBC 8.7  NEUTROABS 5.6  HGB 12.7*  HCT 37.2*  MCV 92.3  PLT 580   Basic Metabolic Panel: Recent Labs  Lab 09/25/17 1628 09/26/17 0415  NA 141 140  K 4.8 4.5  CL 105 108  CO2 26 24  GLUCOSE 124* 101*  BUN 23* 19  CREATININE 1.48* 1.23  CALCIUM 9.3 8.6*   GFR: Estimated Creatinine Clearance: 33.7 mL/min (by C-G formula based on SCr of 1.23 mg/dL). Liver Function Tests: Recent Labs  Lab 09/25/17 1628  AST 51*  ALT 30  ALKPHOS 232*  BILITOT 1.0  PROT 7.7  ALBUMIN 3.7   Recent Labs  Lab 09/25/17 1628  LIPASE 94*   No results for input(s): AMMONIA in the last 168 hours. Coagulation Profile: No results for input(s): INR, PROTIME in the last 168 hours. Cardiac Enzymes: No results for input(s): CKTOTAL, CKMB, CKMBINDEX, TROPONINI in the last 168 hours. BNP (last 3 results) No results for input(s): PROBNP in the last 8760 hours. HbA1C: No results for input(s): HGBA1C in the last 72 hours. CBG: No results for input(s): GLUCAP in the last 168 hours. Lipid Profile: No results for input(s): CHOL, HDL, LDLCALC, TRIG, CHOLHDL, LDLDIRECT in the last 72 hours. Thyroid Function Tests: No results for input(s): TSH, T4TOTAL, FREET4, T3FREE, THYROIDAB in the last 72 hours. Anemia Panel: No results for input(s): VITAMINB12, FOLATE, FERRITIN, TIBC, IRON, RETICCTPCT  in the last 72 hours. Urine analysis:    Component Value Date/Time   COLORURINE YELLOW 09/25/2017 0842   APPEARANCEUR HAZY (A) 09/25/2017 0842   LABSPEC 1.020 09/25/2017 0842   PHURINE 5.0 09/25/2017 0842   GLUCOSEU NEGATIVE 09/25/2017 0842   HGBUR NEGATIVE 09/25/2017 0842   BILIRUBINUR NEGATIVE 09/25/2017 0842   KETONESUR 20 (A) 09/25/2017 0842   PROTEINUR NEGATIVE 09/25/2017 0842   UROBILINOGEN 0.2 06/29/2012 1602   NITRITE NEGATIVE 09/25/2017 0842   LEUKOCYTESUR SMALL (A) 09/25/2017 0842   Sepsis  Labs: @LABRCNTIP (procalcitonin:4,lacticidven:4)  )No results found for this or any previous visit (from the past 240 hour(s)).       Radiology Studies: Mr Abdomen W Wo Contrast  Result Date: 09/25/2017 CLINICAL DATA:  Weight loss and mid abdominal pain. Possible pancreatic mass. EXAM: MRI ABDOMEN WITHOUT AND WITH CONTRAST TECHNIQUE: Multiplanar multisequence MR imaging of the abdomen was performed both before and after the administration of intravenous contrast. CONTRAST:  55mL MULTIHANCE GADOBENATE DIMEGLUMINE 529 MG/ML IV SOLN COMPARISON:  09/24/2017 CT scan FINDINGS: Despite efforts by the technologist and patient, motion artifact is present on today's exam and could not be eliminated. This reduces exam sensitivity and specificity. Lower chest: Mild dependent atelectasis at the lung bases. Hepatobiliary: Innumerable enhancing metastatic lesions are present throughout all segments of the liver. Any index lesion posteriorly in the right hepatic lobe measures 3.1 by 3.8 cm on image 19/3. No biliary dilatation. Pancreas: A pancreatic mass is present with following characteristics. : Size: 2.9 by 2.4 by 2.8 cm Location: Upper pancreatic head, partially exophytic Characterization: Centrally necrotic and solid Enhancement: Hypoenhancing Other Characteristics: None Local extent of mass: Mass abuts the duodenal bulb and descending duodenum, invasion of the structures is not entirely excluded. Vascular Involvement: The mass abuts the gastroduodenal artery but does not appear to of directly abut the celiac trunk, SMA, common hepatic artery, or proper hepatic artery. No appreciable about 9 or invasion of the portal vein, SMV, or splenic vein. Variant hepatic artery anatomy: Conventional (no variant), arising from the celiac trunk ultimately. Bile Duct Involvement: Absent Variant biliary anatomy: No Adjacent Nodes: 1.9 cm in short axis porta hepatis lymph node just below the portal vein, likely malignant.  Omental/Peritoneal Disease: Not observed Distant Metastases: Yes (liver). No dilatation of the dorsal pancreatic duct. Spleen:  Unremarkable Adrenals/Urinary Tract: Adrenal glands unremarkable. Complex Bosniak category 50F cyst of the right kidney lower pole without appreciable enhancement but given the degree of calcification in septation this likely warrants surveillance. Stomach/Bowel: As noted above, the pancreatic mass abuts the duodenal bulb and descending duodenum. There also appears to be a small incidental lipoma in the descending duodenum near the ampulla. Vascular/Lymphatic:  Aortoiliac atherosclerotic vascular disease. Other:  No supplemental non-categorized findings. Musculoskeletal: Lumbar spondylosis and degenerative disc disease. IMPRESSION: 1. Pancreatic head mass with extensive metastatic disease to the liver. Overall appearance favors pancreatic ductal adenocarcinoma with extensive metastatic disease to the liver and associated pathologic adenopathy in the porta hepatis. Other characteristics are as described above; please note that CT abdomen using pancreatic protocol provides for better spatial resolution and is typically the primary exam performed in the setting of new diagnosis of pancreatic cancer. This should likely be coordinated with the oncology service and does not need to be performed as an emergency examination. 2. Despite efforts by the technologist and patient, motion artifact is present on today's exam and could not be eliminated. This reduces exam sensitivity and specificity. 3.  Aortic Atherosclerosis (ICD10-I70.0). 4. Bosniak  category 39F cyst of the right kidney lower pole. Typical guidelines would call for follow up imaging in 6 months time; it may be that the patient's surveillance imaging related to the expected pancreatic malignancy will be adequate to follow up this complex but probably benign cystic lesion. Electronically Signed   By: Van Clines M.D.   On: 09/25/2017  21:18   Mr 3d Recon At Scanner  Result Date: 09/25/2017 CLINICAL DATA:  Weight loss and mid abdominal pain. Possible pancreatic mass. EXAM: MRI ABDOMEN WITHOUT AND WITH CONTRAST TECHNIQUE: Multiplanar multisequence MR imaging of the abdomen was performed both before and after the administration of intravenous contrast. CONTRAST:  66mL MULTIHANCE GADOBENATE DIMEGLUMINE 529 MG/ML IV SOLN COMPARISON:  09/24/2017 CT scan FINDINGS: Despite efforts by the technologist and patient, motion artifact is present on today's exam and could not be eliminated. This reduces exam sensitivity and specificity. Lower chest: Mild dependent atelectasis at the lung bases. Hepatobiliary: Innumerable enhancing metastatic lesions are present throughout all segments of the liver. Any index lesion posteriorly in the right hepatic lobe measures 3.1 by 3.8 cm on image 19/3. No biliary dilatation. Pancreas: A pancreatic mass is present with following characteristics. : Size: 2.9 by 2.4 by 2.8 cm Location: Upper pancreatic head, partially exophytic Characterization: Centrally necrotic and solid Enhancement: Hypoenhancing Other Characteristics: None Local extent of mass: Mass abuts the duodenal bulb and descending duodenum, invasion of the structures is not entirely excluded. Vascular Involvement: The mass abuts the gastroduodenal artery but does not appear to of directly abut the celiac trunk, SMA, common hepatic artery, or proper hepatic artery. No appreciable about 9 or invasion of the portal vein, SMV, or splenic vein. Variant hepatic artery anatomy: Conventional (no variant), arising from the celiac trunk ultimately. Bile Duct Involvement: Absent Variant biliary anatomy: No Adjacent Nodes: 1.9 cm in short axis porta hepatis lymph node just below the portal vein, likely malignant. Omental/Peritoneal Disease: Not observed Distant Metastases: Yes (liver). No dilatation of the dorsal pancreatic duct. Spleen:  Unremarkable Adrenals/Urinary  Tract: Adrenal glands unremarkable. Complex Bosniak category 39F cyst of the right kidney lower pole without appreciable enhancement but given the degree of calcification in septation this likely warrants surveillance. Stomach/Bowel: As noted above, the pancreatic mass abuts the duodenal bulb and descending duodenum. There also appears to be a small incidental lipoma in the descending duodenum near the ampulla. Vascular/Lymphatic:  Aortoiliac atherosclerotic vascular disease. Other:  No supplemental non-categorized findings. Musculoskeletal: Lumbar spondylosis and degenerative disc disease. IMPRESSION: 1. Pancreatic head mass with extensive metastatic disease to the liver. Overall appearance favors pancreatic ductal adenocarcinoma with extensive metastatic disease to the liver and associated pathologic adenopathy in the porta hepatis. Other characteristics are as described above; please note that CT abdomen using pancreatic protocol provides for better spatial resolution and is typically the primary exam performed in the setting of new diagnosis of pancreatic cancer. This should likely be coordinated with the oncology service and does not need to be performed as an emergency examination. 2. Despite efforts by the technologist and patient, motion artifact is present on today's exam and could not be eliminated. This reduces exam sensitivity and specificity. 3.  Aortic Atherosclerosis (ICD10-I70.0). 4. Bosniak category 39F cyst of the right kidney lower pole. Typical guidelines would call for follow up imaging in 6 months time; it may be that the patient's surveillance imaging related to the expected pancreatic malignancy will be adequate to follow up this complex but probably benign cystic lesion. Electronically Signed  By: Van Clines M.D.   On: 09/25/2017 21:18        Scheduled Meds: . feeding supplement  1 Container Oral TID BM  . latanoprost  1 drop Both Eyes QHS  . pantoprazole  40 mg Oral Q0600    . tamsulosin  0.4 mg Oral QHS   Continuous Infusions: . sodium chloride 100 mL/hr at 09/26/17 1104     LOS: 0 days    Time spent: 55 Minutes   Dana Allan, MD  [facility] Pager #: [phone] 7PM-7AM contact night coverage as above

## 2017-09-26 NOTE — Progress Notes (Signed)
Spoke with Dr. Kathlene Cote via phone. Md aware pt has SQ Heparin ordered. Pt has biopsy scheduled for tomorrow. See new order to give 1400 dose and hold next 2 doses.

## 2017-09-26 NOTE — Progress Notes (Signed)
NCM spoke to pt, dtrBlanch Media, and nephew/niece at bedside. Pt states he is able to make his own decisions. He wants to have biopsy tomorrow. Jerry Finner RN CCM Case Mgmt phone [phone]

## 2017-09-27 ENCOUNTER — Telehealth: Payer: Self-pay | Admitting: Oncology

## 2017-09-27 DIAGNOSIS — K8689 Other specified diseases of pancreas: Secondary | ICD-10-CM

## 2017-09-27 DIAGNOSIS — K869 Disease of pancreas, unspecified: Secondary | ICD-10-CM | POA: Diagnosis not present

## 2017-09-27 DIAGNOSIS — K769 Liver disease, unspecified: Secondary | ICD-10-CM

## 2017-09-27 LAB — CANCER ANTIGEN 19-9: CA 19-9: 1 U/mL (ref 0–35)

## 2017-09-27 LAB — PROTIME-INR
INR: 1.2
Prothrombin Time: 15.1 seconds (ref 11.4–15.2)

## 2017-09-27 NOTE — Progress Notes (Signed)
Palliative Medicine RN Note: Consult order noted. Family and patient would like to wait until after biopsy to talk about goals. Will follow up once results are read.  Marjie Skiff Jenniger Figiel, RN, BSN, [facility] 09/27/2017 7:54 AM Office [phone]

## 2017-09-27 NOTE — Progress Notes (Signed)
Nutrition Brief Note  RD consulted for nutritional assessment.  Per chart review, palliative care has been consulted for [place] following biopsy results.  Will provide appropriate assessment and interventions depending on GOC decisions.  Clayton Bibles, MS, RD, [place] [facility] Pager: [phone] After Hours Pager: [phone]

## 2017-09-27 NOTE — Progress Notes (Signed)
Pt's family member reports that patient has decided to go to bed early tonight and has requested not to be disturbed. Pt resting quielty with eyes closed at this time.

## 2017-09-27 NOTE — Discharge Summary (Signed)
Physician Discharge Summary  Patient ID: Jerry Berry MRN: [id number] DOB/AGE: [birth year] 83 y.o.  Admit date: 09/25/2017 Discharge date: 09/27/2017  Admission Diagnoses:  Discharge Diagnoses:  Active Problems:   Dehydration   Discharged Condition: stable  Hospital Course: Patient is a 83 year old male with past medical history significant for stage 3 chronic kidney disease, arthritis, and hypertension. Patient was admitted with persistent nausea, epigastric pain and weight loss.  CT and MRI abdomen and pelvis revealed pancreatic mass with likely liver metastasis. Overall appearance is said to favor pancreatic ductal adenocarcinoma with extensive metastatic disease to the liver and associated pathologic adenopathy in the porta hepatis. Patient was admitted for further work up, with the plan being to proceed with biopsy of the likely liver metastasis. The Oncology team advised consulting Interventional radiology to biopsy the liver lesions. The biopsy was scheduled for today (09/27/17), but patient changed his mind, stating that he would want to discuss with his PCP further (prior to the biopsy). Patient will be discharged to the care of the PCP.  Consults: Oncology and Interventional Radiology  Significant Diagnostic Studies: CT and MRI of abdomen and pelvis   Discharge Exam: Blood pressure 128/73, pulse 82, temperature 98.4 F (36.9 C), temperature source Oral, resp. rate 20, height 5\' 6"  (1.676 m), weight 63.5 kg (140 lb), SpO2 96 %.  Disposition: 01-Home or Self Care  Discharge Instructions    Call MD for:   Complete by:  As directed    Call MD with worsening symptoms   Diet - low sodium heart healthy   Complete by:  As directed    Increase activity slowly   Complete by:  As directed      Allergies as of 09/27/2017   No Known Allergies     Medication List    STOP taking these medications   acetaminophen 500 MG tablet Commonly known as:  TYLENOL   hydrOXYzine 25 MG  tablet Commonly known as:  ATARAX/VISTARIL     TAKE these medications   aspirin 81 MG chewable tablet Chew 1 tablet (81 mg total) by mouth at bedtime. Restart in 1 week   latanoprost 0.005 % ophthalmic solution Commonly known as:  XALATAN Place 1 drop into both eyes at bedtime.   metoprolol succinate 25 MG 24 hr tablet Commonly known as:  TOPROL-XL take 1/2 tablet by mouth at bedtime   mirtazapine 15 MG tablet Commonly known as:  REMERON Take 15 mg by mouth at bedtime.   multivitamin with minerals Tabs tablet Take 1 tablet by mouth at bedtime.   pantoprazole 40 MG tablet Commonly known as:  PROTONIX Take 40 mg by mouth every evening.   tamsulosin 0.4 MG Caps capsule Commonly known as:  FLOMAX Take 0.4 mg by mouth at bedtime.   timolol 0.5 % ophthalmic gel-forming Commonly known as:  TIMOPTIC-XR Place 1 drop into both eyes daily.        SignedBonnell Public 09/27/2017, 3:02 PM

## 2017-09-27 NOTE — Progress Notes (Signed)
Pt checked frequently, pt resting quietly at long intervals with eyes closed, resp even and unlabored. No acute distress noted. Uneventful night.

## 2017-09-27 NOTE — Telephone Encounter (Signed)
Left message on voicemail regarding appt date/time/location/phone #

## 2017-09-28 LAB — CANCER ANTIGEN 19-9: CA 19-9: 1 U/mL (ref 0–35)

## 2017-09-30 ENCOUNTER — Other Ambulatory Visit: Payer: Self-pay | Admitting: Internal Medicine

## 2017-09-30 DIAGNOSIS — K8689 Other specified diseases of pancreas: Secondary | ICD-10-CM

## 2017-10-01 ENCOUNTER — Other Ambulatory Visit: Payer: Self-pay | Admitting: Internal Medicine

## 2017-10-01 DIAGNOSIS — R160 Hepatomegaly, not elsewhere classified: Secondary | ICD-10-CM

## 2017-10-01 DIAGNOSIS — K8689 Other specified diseases of pancreas: Secondary | ICD-10-CM

## 2017-10-06 ENCOUNTER — Other Ambulatory Visit: Payer: Self-pay

## 2017-10-06 ENCOUNTER — Telehealth: Payer: Self-pay | Admitting: Oncology

## 2017-10-06 ENCOUNTER — Encounter: Payer: Self-pay | Admitting: Oncology

## 2017-10-06 ENCOUNTER — Ambulatory Visit: Payer: Medicare Other | Admitting: Oncology

## 2017-10-06 VITALS — BP 159/63 | HR 107 | Temp 98.8°F | Resp 16 | Ht 66.0 in | Wt 136.4 lb

## 2017-10-06 DIAGNOSIS — K769 Liver disease, unspecified: Secondary | ICD-10-CM | POA: Diagnosis not present

## 2017-10-06 DIAGNOSIS — R109 Unspecified abdominal pain: Secondary | ICD-10-CM

## 2017-10-06 DIAGNOSIS — R630 Anorexia: Secondary | ICD-10-CM | POA: Diagnosis not present

## 2017-10-06 DIAGNOSIS — R634 Abnormal weight loss: Secondary | ICD-10-CM | POA: Diagnosis not present

## 2017-10-06 DIAGNOSIS — K869 Disease of pancreas, unspecified: Secondary | ICD-10-CM

## 2017-10-06 DIAGNOSIS — N189 Chronic kidney disease, unspecified: Secondary | ICD-10-CM

## 2017-10-06 DIAGNOSIS — K8689 Other specified diseases of pancreas: Secondary | ICD-10-CM

## 2017-10-06 MED ORDER — OXYCODONE HCL ER 10 MG PO T12A: 10.0000 mg | EXTENDED_RELEASE_TABLET | Freq: Two times a day (BID) | ORAL | 0 refills | Status: DC

## 2017-10-06 NOTE — Progress Notes (Signed)
  Oncology Nurse Navigator Documentation  Navigator Location: [facility] (10/06/17 1600) Referral date to RadOnc/MedOnc: 09/27/17 (10/06/17 1600) )Navigator Encounter Type: Initial MedOnc (10/06/17 1600)   Abnormal Finding Date: 09/24/17 (10/06/17 1600)                 Patient Visit Type: MedOnc;Initial (10/06/17 1600) Treatment Phase: Abnormal Scans (10/06/17 1600) Barriers/Navigation Needs: Coordination of Care (10/06/17 1600)   Interventions: Education (10/06/17 1600)     Education Method: Verbal (10/06/17 1600)   Met with patient and family members prior to and during initial med/onc appointment with Dr. Benay Spice. I provided patient with a booklet from the [facility] on Pancreatic Cancer and provided my contact information for further questions or concerns. I also provided teaching on ways to alleviate constipation secondary to pain medications. Patient and family member verbalized that I am available with questions or concerns.   Acuity: Level 2 (10/06/17 1600)   Acuity Level 2: Initial guidance, education and coordination as needed;Educational needs;Ongoing guidance and education throughout treatment as needed (10/06/17 1600)     Time Spent with Patient: 75 (10/06/17 1600)

## 2017-10-06 NOTE — Telephone Encounter (Signed)
Scheduled appt per 1/2 los - Gave patient AVS and calender per los.  

## 2017-10-06 NOTE — Progress Notes (Signed)
[facility] New Patient Consult   Referring MD: Foye Spurling, [address] [place], [place] [address]   Daleon Willinger 84 y.o.  May 05, 1922    Reason for Referral: Pancreas mass, liver metastases   HPI: Mr. Power was admitted 09/25/2017 with anorexia, weight loss, nausea, and abdominal pain.  CTs of the chest, abdomen, and pelvis on 09/24/2017 revealed multiple round low-density liver lesions consistent with metastases.  A possible mass was noted in the pancreas head.  Prostatic enlargement.  An MRI of the abdomen 09/25/2017 confirmed innumerable liver metastases and a pancreas head mass without vascular invasion.  A malignant appearing porta hepatis node.  The pancreas mass abuts the duodenal bulb and descending duodenum.  He was scheduled for a liver biopsy by interventional radiology, but declined this.  He was discharged home 09/27/2017.  Mr. Bruun is accompanied today by multiple family members.  They report he continues to have abdominal pain, partially relieved with hydrocodone.  He has constant pain and wakes up in pain.    Past Medical History:  Diagnosis Date  . Abdominal desmoid tumor   . Arthritis    "aching in shoulders, center of lower back" (05/29/2016)  . Bronchitis   . Chronic kidney disease    /notes 05/29/2016  . History of lower GI bleeding   . Hypertension   . Prostate pain   . Ulcerative colitis ([place])     .  Glaucoma   .  History of an ulcer  Past Surgical History:  Procedure Laterality Date  . COLONOSCOPY N/A 05/30/2014   Procedure: COLONOSCOPY;  Surgeon: Milus Banister, MD;  Location: [facility];  Service: Endoscopy;  Laterality: N/A;  . TOOTH EXTRACTION      Medications: Reviewed  Allergies: No Known Allergies  Family history: A sister had colon cancer.  A sister had "bone cancer ".  A brother had "stomach "cancer.  He does not have biologic children.  Social History:   He lives in [place]  with a daughter.  He previously worked as a Radio producer.  He does not use cigarettes.  He has a history of alcohol use.  No transfusion history.  ROS:   Positives include: Anorexia, 20 pound weight loss, insomnia, right upper abdominal pain, constipation, generalized muscle weakness  A complete ROS was otherwise negative.  Physical Exam:  Blood pressure (!) 159/63, pulse (!) 107, temperature 98.8 F (37.1 C), temperature source Oral, resp. rate 16, height 5\' 6"  (1.676 m), weight 136 lb 6.4 oz (61.9 kg), SpO2 100 %.  HEENT: Oral cavity without visible mass, neck without mass Lungs: Scattered inspiratory rhonchi at the lower posterior chest bilaterally, no respiratory distress Cardiac: Regular rate and rhythm Abdomen: No hepatosplenomegaly, no mass, nontender, no apparent ascites, small umbilical hernia GU: Uncircumcised male, testes without mass Vascular: No leg edema Lymph nodes: No cervical, supraclavicular, axillary, or inguinal nodes Neurologic: Alert, the motor exam appears intact in the upper and lower extremities, he has difficulty with memory recall and focusing in discussion, intermittently agitated Skin: No rash Musculoskeletal: No spine tenderness   LAB:  CBC  Lab Results  Component Value Date   WBC 8.0 09/26/2017   HGB 11.5 (L) 09/26/2017   HCT 35.2 (L) 09/26/2017   MCV 93.1 09/26/2017   PLT 231 09/26/2017   NEUTROABS 5.6 09/25/2017        CMP     Component Value Date/Time   NA 140 09/26/2017 0415   K 4.5 09/26/2017  0415   CL 108 09/26/2017 0415   CO2 24 09/26/2017 0415   GLUCOSE 101 (H) 09/26/2017 0415   BUN 19 09/26/2017 0415   CREATININE 1.29 (H) 09/26/2017 1245   CALCIUM 8.6 (L) 09/26/2017 0415   PROT 7.7 09/25/2017 1628   ALBUMIN 3.7 09/25/2017 1628   AST 51 (H) 09/25/2017 1628   ALT 30 09/25/2017 1628   ALKPHOS 232 (H) 09/25/2017 1628   BILITOT 1.0 09/25/2017 1628   GFRNONAA 46 (L) 09/26/2017 1245   GFRAA 53 (L) 09/26/2017 1245   CA 19-9  on 09/27/2018: 1  Imaging:  CT images 09/24/2017-reviewed with Mr. Dolberry and his family   Assessment/Plan:   1. Pancreas mass/multiple liver lesions  CT abdomen and MRI abdomen December 2018-pancreas head mass, multiple liver lesions, porta hepatis adenopathy 2. Anorexia/weight loss  3.   Pain secondary to #1  4.   Chronic renal insufficiency    Disposition:   Mr. Graham presents with abdominal pain and anorexia.  A CT and MRI of the abdomen confirm a pancreas head mass and multiple liver metastases.  The clinical presentation is consistent with metastatic pancreas cancer.  I reviewed the CT images and discussed the probable diagnosis with Mr. Daughtridge and his family.  He agrees to proceed a biopsy of a liver lesion on 10/12/2017.  He has pain secondary to the pancreas mass and liver metastases.  He will begin OxyContin and continue hydrocodone as needed.  He will begin a bowel regimen.  He appears to have limited understanding of the current situation and I suspect he has early dementia.  His family members acknowledged that he has significant memory loss and "agitation ".  I will recommend Hospice care if a diagnosis of metastatic pancreas cancer is confirmed.  He will return for an office visit and further discussion on 10/15/2017.  50 minutes were spent with the patient today.  The majority of the time was used for counseling and coordination of care.  Betsy Coder, MD  10/06/2017, 5:21 PM

## 2017-10-06 NOTE — Progress Notes (Signed)
Patient here for new visit. Family reports difficulty swallowing patient denies, increasing weakness, lack of appetite. Agitation in evenings.

## 2017-10-12 ENCOUNTER — Ambulatory Visit (HOSPITAL_COMMUNITY)
Admission: RE | Admit: 2017-10-12 | Discharge: 2017-10-12 | Disposition: A | Discharge status: Home / Self Care | Payer: Medicare Other | Source: Ambulatory Visit | Attending: Internal Medicine | Admitting: Internal Medicine

## 2017-10-12 DIAGNOSIS — Z79899 Other long term (current) drug therapy: Secondary | ICD-10-CM | POA: Diagnosis not present

## 2017-10-12 DIAGNOSIS — N189 Chronic kidney disease, unspecified: Secondary | ICD-10-CM | POA: Diagnosis not present

## 2017-10-12 DIAGNOSIS — I129 Hypertensive chronic kidney disease with stage 1 through stage 4 chronic kidney disease, or unspecified chronic kidney disease: Secondary | ICD-10-CM | POA: Diagnosis not present

## 2017-10-12 DIAGNOSIS — C259 Malignant neoplasm of pancreas, unspecified: Secondary | ICD-10-CM | POA: Diagnosis not present

## 2017-10-12 DIAGNOSIS — Z7982 Long term (current) use of aspirin: Secondary | ICD-10-CM | POA: Insufficient documentation

## 2017-10-12 DIAGNOSIS — C787 Secondary malignant neoplasm of liver and intrahepatic bile duct: Secondary | ICD-10-CM | POA: Insufficient documentation

## 2017-10-12 DIAGNOSIS — R160 Hepatomegaly, not elsewhere classified: Secondary | ICD-10-CM

## 2017-10-12 DIAGNOSIS — K8689 Other specified diseases of pancreas: Secondary | ICD-10-CM

## 2017-10-12 DIAGNOSIS — K869 Disease of pancreas, unspecified: Secondary | ICD-10-CM | POA: Diagnosis present

## 2017-10-12 LAB — CBC
HEMATOCRIT: 27.8 % — AB (ref 39.0–52.0)
Hemoglobin: 9 g/dL — ABNORMAL LOW (ref 13.0–17.0)
MCH: 30 pg (ref 26.0–34.0)
MCHC: 32.4 g/dL (ref 30.0–36.0)
MCV: 92.7 fL (ref 78.0–100.0)
Platelets: 304 10*3/uL (ref 150–400)
RBC: 3 MIL/uL — ABNORMAL LOW (ref 4.22–5.81)
RDW: 14.2 % (ref 11.5–15.5)
WBC: 8.7 10*3/uL (ref 4.0–10.5)

## 2017-10-12 LAB — PROTIME-INR
INR: 1.3
Prothrombin Time: 16.1 seconds — ABNORMAL HIGH (ref 11.4–15.2)

## 2017-10-12 LAB — APTT: aPTT: 31 seconds (ref 24–36)

## 2017-10-12 MED ORDER — FENTANYL CITRATE (PF) 100 MCG/2ML IJ SOLN
INTRAMUSCULAR | Status: AC
  Filled 2017-10-12: qty 2

## 2017-10-12 MED ORDER — HYDROCODONE-ACETAMINOPHEN 5-325 MG PO TABS: 1.0000 | ORAL_TABLET | ORAL | Status: DC | PRN

## 2017-10-12 MED ORDER — SODIUM CHLORIDE 0.9 % IV SOLN
INTRAVENOUS | Status: AC | PRN
  Administered 2017-10-12: 10 mL/h via INTRAVENOUS

## 2017-10-12 MED ORDER — SODIUM CHLORIDE 0.9 % IV SOLN: INTRAVENOUS | Status: DC

## 2017-10-12 MED ORDER — FENTANYL CITRATE (PF) 100 MCG/2ML IJ SOLN
INTRAMUSCULAR | Status: AC | PRN
  Administered 2017-10-12 (×2): 25 ug via INTRAVENOUS

## 2017-10-12 MED ORDER — MIDAZOLAM HCL 2 MG/2ML IJ SOLN
INTRAMUSCULAR | Status: AC | PRN
  Administered 2017-10-12: 1 mg via INTRAVENOUS
  Administered 2017-10-12: 0.5 mg via INTRAVENOUS

## 2017-10-12 MED ORDER — MIDAZOLAM HCL 2 MG/2ML IJ SOLN
INTRAMUSCULAR | Status: AC
  Filled 2017-10-12: qty 2

## 2017-10-12 MED ORDER — LIDOCAINE HCL (PF) 1 % IJ SOLN
INTRAMUSCULAR | Status: AC
  Filled 2017-10-12: qty 30

## 2017-10-12 MED ORDER — GELATIN ABSORBABLE 12-7 MM EX MISC
CUTANEOUS | Status: AC
  Filled 2017-10-12: qty 1

## 2017-10-12 NOTE — Procedures (Signed)
  Procedure: US core bx liver lesion  18g x3 EBL:   minimal Complications:  none immediate  See full dictation in [facility] PACS.  D. Mozetta Murfin MD Main # [phone] Pager  [phone]    

## 2017-10-12 NOTE — Discharge Instructions (Addendum)
Liver Biopsy, Care After °Refer to this sheet in the next few weeks. These instructions provide you with information on caring for yourself after your procedure. Your health care provider may also give you more specific instructions. Your treatment has been planned according to current medical practices, but problems sometimes occur. Call your health care provider if you have any problems or questions after your procedure. °What can I expect after the procedure? °After your procedure, it is typical to have the following: °· A small amount of discomfort in the area where the biopsy was done and in the right shoulder or shoulder blade. °· A small amount of bruising around the area where the biopsy was done and on the skin over the liver. °· Sleepiness and fatigue for the rest of the day. ° °Follow these instructions at home: °· Rest at home for 1-2 days or as directed by your health care provider. °· Have a friend or family member stay with you for at least 24 hours. °· Because of the medicines used during the procedure, you should not do the following things in the first 24 hours: °? Drive. °? Use machinery. °? Be responsible for the care of other people. °? Sign legal documents. °? Take a bath or shower. °· There are many different ways to close and cover an incision, including stitches, skin glue, and adhesive strips. Follow your health care provider's instructions on: °? Incision care. °? Bandage (dressing) changes and removal. °? Incision closure removal. °· Do not drink alcohol in the first week. °· Do not lift more than 5 pounds or play contact sports for 2 weeks after this test. °· Take medicines only as directed by your health care provider. Do not take medicine containing aspirin or non-steroidal anti-inflammatory medicines such as ibuprofen for 1 week after this test. °· It is your responsibility to get your test results. °Contact a health care provider if: °· You have increased bleeding from an incision  that results in more than a small spot of blood. °· You have redness, swelling, or increasing pain in any incisions. °· You notice a discharge or a bad smell coming from any of your incisions. °· You have a fever or chills. °Get help right away if: °· You develop swelling, bloating, or pain in your abdomen. °· You become dizzy or faint. °· You develop a rash. °· You are nauseous or vomit. °· You have difficulty breathing, feel short of breath, or feel faint. °· You develop chest pain. °· You have problems with your speech or vision. °· You have trouble balancing or moving your arms or legs. °This information is not intended to replace advice given to you by your health care provider. Make sure you discuss any questions you have with your health care provider. °Document Released: 04/10/2005 Document Revised: 02/27/2016 Document Reviewed: 11/17/2013 °Elsevier Interactive Patient Education © 2018 [employer] °Moderate Conscious Sedation, Adult, Care After °These instructions provide you with information about caring for yourself after your procedure. Your health care provider may also give you more specific instructions. Your treatment has been planned according to current medical practices, but problems sometimes occur. Call your health care provider if you have any problems or questions after your procedure. °What can I expect after the procedure? °After your procedure, it is common: °· To feel sleepy for several hours. °· To feel clumsy and have poor balance for several hours. °· To have poor judgment for several hours. °· To vomit if you eat   too soon. ° °Follow these instructions at home: °For at least 24 hours after the procedure: ° °· Do not: °? Participate in activities where you could fall or become injured. °? Drive. °? Use heavy machinery. °? Drink alcohol. °? Take sleeping pills or medicines that cause drowsiness. °? Make important decisions or sign legal documents. °? Take care of children on your  own. °· Rest. °Eating and drinking °· Follow the diet recommended by your health care provider. °· If you vomit: °? Drink water, juice, or soup when you can drink without vomiting. °? Make sure you have little or no nausea before eating solid foods. °General instructions °· Have a responsible adult stay with you until you are awake and alert. °· Take over-the-counter and prescription medicines only as told by your health care provider. °· If you smoke, do not smoke without supervision. °· Keep all follow-up visits as told by your health care provider. This is important. °Contact a health care provider if: °· You keep feeling nauseous or you keep vomiting. °· You feel light-headed. °· You develop a rash. °· You have a fever. °Get help right away if: °· You have trouble breathing. °This information is not intended to replace advice given to you by your health care provider. Make sure you discuss any questions you have with your health care provider. °Document Released: 07/12/2013 Document Revised: 02/24/2016 Document Reviewed: 01/11/2016 °Elsevier Interactive Patient Education © 2018 [employer] ° °

## 2017-10-12 NOTE — H&P (Signed)
Chief Complaint: Patient was seen in consultation today for biopsy of liver lesion at the request of  Dr. Julieanne Manson  Referring Physician(s): Dr. Julieanne Manson  Supervising Physician: Dr. Vernard Gambles  Patient Status: [facility] - In-pt  History of Present Illness: Jerry Berry is a 84 y.o. male admitted with a couple months of abd pain, wt loss, and anorexia. He is found to have a pancreatic mass and liver lesions c/w metastatic process. IR is asked to biopsy liver lesion for definitive diagnosis PMX, meds, labs, imaging reviewed. Daughter at bedside. Has been NPO today  Past Medical History:  Diagnosis Date  . Abdominal desmoid tumor   . Arthritis    "aching in shoulders, center of lower back" (05/29/2016)  . Bronchitis   . Chronic kidney disease    /notes 05/29/2016  . History of lower GI bleeding   . Hypertension   . Prostate pain   . Ulcerative colitis [facility])     Past Surgical History:  Procedure Laterality Date  . COLONOSCOPY N/A 05/30/2014   Procedure: COLONOSCOPY;  Surgeon: Milus Banister, MD;  Location: [place];  Service: Endoscopy;  Laterality: N/A;  . TOOTH EXTRACTION      Allergies: Patient has no known allergies.  Medications:  Current Outpatient Medications:  .  aspirin 81 MG chewable tablet, Chew 1 tablet (81 mg total) by mouth at bedtime. Restart in 1 week, Disp: , Rfl:  .  HYDROcodone-acetaminophen (NORCO) 7.5-325 MG tablet, Take 1 tablet by mouth every 6 (six) hours as needed., Disp: , Rfl: 0 .  latanoprost (XALATAN) 0.005 % ophthalmic solution, Place 1 drop into both eyes at bedtime., Disp: , Rfl:  .  metoprolol succinate (TOPROL-XL) 25 MG 24 hr tablet, take 1/2 tablet by mouth at bedtime, Disp: 15 tablet, Rfl: 11 .  mirtazapine (REMERON) 15 MG tablet, Take 15 mg by mouth at bedtime., Disp: , Rfl: 0 .  Multiple Vitamin (MULITIVITAMIN WITH MINERALS) TABS, Take 1 tablet by mouth at bedtime. , Disp: , Rfl:  .  oxyCODONE (OXYCONTIN) 10 mg 12 hr  tablet, Take 1 tablet (10 mg total) by mouth every 12 (twelve) hours., Disp: 30 tablet, Rfl: 0 .  pantoprazole (PROTONIX) 40 MG tablet, Take 40 mg by mouth every evening., Disp: , Rfl: 0 .  Tamsulosin HCl (FLOMAX) 0.4 MG CAPS, Take 0.4 mg by mouth at bedtime., Disp: , Rfl:  .  timolol (TIMOPTIC-XR) 0.5 % ophthalmic gel-forming, Place 1 drop into both eyes daily., Disp: , Rfl: 0    Family History  Problem Relation Age of Onset  . Heart disease Mother   . Diabetes Mother   . Parkinson's disease Father   . Cancer Sister        colon  . Cancer Brother        stomach  . Cancer Sister        bone  . Cancer Sister     Social History   Socioeconomic History  . Marital status: Widowed    Spouse name: Not on file  . Number of children: Not on file  . Years of education: Not on file  . Highest education level: Not on file  Social Needs  . Financial resource strain: Not on file  . Food insecurity - worry: Not on file  . Food insecurity - inability: Not on file  . Transportation needs - medical: Not on file  . Transportation needs - non-medical: Not on file  Occupational History  . Not on file  Tobacco Use  . Smoking status: Never Smoker  . Smokeless tobacco: Never Used  Substance and Sexual Activity  . Alcohol use: Yes    Alcohol/week: 4.2 oz    Types: 7 Cans of beer per week  . Drug use: No  . Sexual activity: No  Other Topics Concern  . Not on file  Social History Narrative  . Not on file     Review of Systems: A 12 point ROS discussed and pertinent positives are indicated in the HPI above.  All other systems are negative.  Review of Systems  Vital Signs: BP 125/72   Pulse 98   Temp 98.2 F (36.8 C)   Resp 18   Ht 5\' 6"  (1.676 m)   Wt 136 lb (61.7 kg)   SpO2 99%   BMI 21.95 kg/m   Physical Exam  Constitutional: He is oriented to person, place, and time. He appears well-developed.  Non-toxic appearance. No distress.  HENT:  Head: Normocephalic.    Mouth/Throat: Oropharynx is clear and moist.  Neck: Normal range of motion. No JVD present. No tracheal deviation present.  Cardiovascular: Normal rate, regular rhythm and normal heart sounds.  Pulmonary/Chest: Effort normal and breath sounds normal. No respiratory distress.  Abdominal: Soft. He exhibits no distension. There is no tenderness.  Neurological: He is alert and oriented to person, place, and time.  Skin: Skin is warm and dry.  Psychiatric: He has a normal mood and affect.    Imaging: Ct Abdomen Pelvis Wo Contrast  Result Date: 09/24/2017 CLINICAL DATA:  Weight loss, mid abdominal pain. EXAM: CT CHEST, ABDOMEN AND PELVIS WITHOUT CONTRAST TECHNIQUE: Multidetector CT imaging of the chest, abdomen and pelvis was performed following the standard protocol without IV contrast. COMPARISON:  CT scan of May 27, 2014. FINDINGS: CT CHEST FINDINGS Cardiovascular: Atherosclerosis of thoracic aorta is noted without aneurysm formation. Normal cardiac size. No pericardial effusion. Mild coronary artery calcifications are noted. Mediastinum/Nodes: No enlarged mediastinal, hilar, or axillary lymph nodes. Thyroid gland, trachea, and esophagus demonstrate no significant findings. Lungs/Pleura: No pneumothorax or pleural effusion is noted. No acute pulmonary disease is noted. Musculoskeletal: No chest wall mass or suspicious bone lesions identified. CT ABDOMEN PELVIS FINDINGS Hepatobiliary: Multiple rounded low densities are noted throughout hepatic parenchyma consistent with metastatic disease. No gallstones are noted. Pancreas: Possible 3.5 cm solid mass is seen in pancreatic head concerning for malignancy. Further evaluation with MRI is recommended. No ductal dilatation is noted. Spleen: Normal in size without focal abnormality. Adrenals/Urinary Tract: Adrenal glands are unremarkable. Stable right renal cysts are noted. No hydronephrosis or renal obstruction is noted. No renal or ureteral calculi are  noted. Urinary bladder is unremarkable. Stomach/Bowel: Stomach is within normal limits. Appendix appears normal. No evidence of bowel wall thickening, distention, or inflammatory changes. Stool is noted throughout the colon. Vascular/Lymphatic: Aortic atherosclerosis. No enlarged abdominal or pelvic lymph nodes. Reproductive: Moderate prostatic enlargement is noted. Other: No abdominal wall hernia or abnormality. No abdominopelvic ascites. Musculoskeletal: No acute or significant osseous findings. IMPRESSION: Multiple rounded low densities are noted throughout hepatic parenchyma consistent with metastatic disease. Possible 3.5 cm solid mass seen in pancreatic head concerning for malignancy. MRI is recommended for further evaluation. Atherosclerosis of thoracic and abdominal aorta is noted without aneurysm formation. Stable right renal cysts. Moderate prostatic enlargement. These results will be called to the ordering clinician or representative by the Radiologist Assistant, and communication documented in the PACS or zVision Dashboard. Electronically Signed   By: Sabino Dick  Brooke Bonito, M.D.   On: 09/24/2017 14:30   Ct Chest Wo Contrast  Result Date: 09/24/2017 CLINICAL DATA:  Weight loss, mid abdominal pain. EXAM: CT CHEST, ABDOMEN AND PELVIS WITHOUT CONTRAST TECHNIQUE: Multidetector CT imaging of the chest, abdomen and pelvis was performed following the standard protocol without IV contrast. COMPARISON:  CT scan of May 27, 2014. FINDINGS: CT CHEST FINDINGS Cardiovascular: Atherosclerosis of thoracic aorta is noted without aneurysm formation. Normal cardiac size. No pericardial effusion. Mild coronary artery calcifications are noted. Mediastinum/Nodes: No enlarged mediastinal, hilar, or axillary lymph nodes. Thyroid gland, trachea, and esophagus demonstrate no significant findings. Lungs/Pleura: No pneumothorax or pleural effusion is noted. No acute pulmonary disease is noted. Musculoskeletal: No chest wall mass  or suspicious bone lesions identified. CT ABDOMEN PELVIS FINDINGS Hepatobiliary: Multiple rounded low densities are noted throughout hepatic parenchyma consistent with metastatic disease. No gallstones are noted. Pancreas: Possible 3.5 cm solid mass is seen in pancreatic head concerning for malignancy. Further evaluation with MRI is recommended. No ductal dilatation is noted. Spleen: Normal in size without focal abnormality. Adrenals/Urinary Tract: Adrenal glands are unremarkable. Stable right renal cysts are noted. No hydronephrosis or renal obstruction is noted. No renal or ureteral calculi are noted. Urinary bladder is unremarkable. Stomach/Bowel: Stomach is within normal limits. Appendix appears normal. No evidence of bowel wall thickening, distention, or inflammatory changes. Stool is noted throughout the colon. Vascular/Lymphatic: Aortic atherosclerosis. No enlarged abdominal or pelvic lymph nodes. Reproductive: Moderate prostatic enlargement is noted. Other: No abdominal wall hernia or abnormality. No abdominopelvic ascites. Musculoskeletal: No acute or significant osseous findings. IMPRESSION: Multiple rounded low densities are noted throughout hepatic parenchyma consistent with metastatic disease. Possible 3.5 cm solid mass seen in pancreatic head concerning for malignancy. MRI is recommended for further evaluation. Atherosclerosis of thoracic and abdominal aorta is noted without aneurysm formation. Stable right renal cysts. Moderate prostatic enlargement. These results will be called to the ordering clinician or representative by the Radiologist Assistant, and communication documented in the PACS or zVision Dashboard. Electronically Signed   By: Marijo Conception, M.D.   On: 09/24/2017 14:30   Mr Abdomen W Wo Contrast  Result Date: 09/25/2017 CLINICAL DATA:  Weight loss and mid abdominal pain. Possible pancreatic mass. EXAM: MRI ABDOMEN WITHOUT AND WITH CONTRAST TECHNIQUE: Multiplanar multisequence MR  imaging of the abdomen was performed both before and after the administration of intravenous contrast. CONTRAST:  31mL MULTIHANCE GADOBENATE DIMEGLUMINE 529 MG/ML IV SOLN COMPARISON:  09/24/2017 CT scan FINDINGS: Despite efforts by the technologist and patient, motion artifact is present on today's exam and could not be eliminated. This reduces exam sensitivity and specificity. Lower chest: Mild dependent atelectasis at the lung bases. Hepatobiliary: Innumerable enhancing metastatic lesions are present throughout all segments of the liver. Any index lesion posteriorly in the right hepatic lobe measures 3.1 by 3.8 cm on image 19/3. No biliary dilatation. Pancreas: A pancreatic mass is present with following characteristics. : Size: 2.9 by 2.4 by 2.8 cm Location: Upper pancreatic head, partially exophytic Characterization: Centrally necrotic and solid Enhancement: Hypoenhancing Other Characteristics: None Local extent of mass: Mass abuts the duodenal bulb and descending duodenum, invasion of the structures is not entirely excluded. Vascular Involvement: The mass abuts the gastroduodenal artery but does not appear to of directly abut the celiac trunk, SMA, common hepatic artery, or proper hepatic artery. No appreciable about 9 or invasion of the portal vein, SMV, or splenic vein. Variant hepatic artery anatomy: Conventional (no variant), arising from the celiac trunk ultimately.  Bile Duct Involvement: Absent Variant biliary anatomy: No Adjacent Nodes: 1.9 cm in short axis porta hepatis lymph node just below the portal vein, likely malignant. Omental/Peritoneal Disease: Not observed Distant Metastases: Yes (liver). No dilatation of the dorsal pancreatic duct. Spleen:  Unremarkable Adrenals/Urinary Tract: Adrenal glands unremarkable. Complex Bosniak category 68F cyst of the right kidney lower pole without appreciable enhancement but given the degree of calcification in septation this likely warrants surveillance.  Stomach/Bowel: As noted above, the pancreatic mass abuts the duodenal bulb and descending duodenum. There also appears to be a small incidental lipoma in the descending duodenum near the ampulla. Vascular/Lymphatic:  Aortoiliac atherosclerotic vascular disease. Other:  No supplemental non-categorized findings. Musculoskeletal: Lumbar spondylosis and degenerative disc disease. IMPRESSION: 1. Pancreatic head mass with extensive metastatic disease to the liver. Overall appearance favors pancreatic ductal adenocarcinoma with extensive metastatic disease to the liver and associated pathologic adenopathy in the porta hepatis. Other characteristics are as described above; please note that CT abdomen using pancreatic protocol provides for better spatial resolution and is typically the primary exam performed in the setting of new diagnosis of pancreatic cancer. This should likely be coordinated with the oncology service and does not need to be performed as an emergency examination. 2. Despite efforts by the technologist and patient, motion artifact is present on today's exam and could not be eliminated. This reduces exam sensitivity and specificity. 3.  Aortic Atherosclerosis (ICD10-I70.0). 4. Bosniak category 68F cyst of the right kidney lower pole. Typical guidelines would call for follow up imaging in 6 months time; it may be that the patient's surveillance imaging related to the expected pancreatic malignancy will be adequate to follow up this complex but probably benign cystic lesion. Electronically Signed   By: Van Clines M.D.   On: 09/25/2017 21:18   Mr 3d Recon At Scanner  Result Date: 09/25/2017 CLINICAL DATA:  Weight loss and mid abdominal pain. Possible pancreatic mass. EXAM: MRI ABDOMEN WITHOUT AND WITH CONTRAST TECHNIQUE: Multiplanar multisequence MR imaging of the abdomen was performed both before and after the administration of intravenous contrast. CONTRAST:  65mL MULTIHANCE GADOBENATE DIMEGLUMINE  529 MG/ML IV SOLN COMPARISON:  09/24/2017 CT scan FINDINGS: Despite efforts by the technologist and patient, motion artifact is present on today's exam and could not be eliminated. This reduces exam sensitivity and specificity. Lower chest: Mild dependent atelectasis at the lung bases. Hepatobiliary: Innumerable enhancing metastatic lesions are present throughout all segments of the liver. Any index lesion posteriorly in the right hepatic lobe measures 3.1 by 3.8 cm on image 19/3. No biliary dilatation. Pancreas: A pancreatic mass is present with following characteristics. : Size: 2.9 by 2.4 by 2.8 cm Location: Upper pancreatic head, partially exophytic Characterization: Centrally necrotic and solid Enhancement: Hypoenhancing Other Characteristics: None Local extent of mass: Mass abuts the duodenal bulb and descending duodenum, invasion of the structures is not entirely excluded. Vascular Involvement: The mass abuts the gastroduodenal artery but does not appear to of directly abut the celiac trunk, SMA, common hepatic artery, or proper hepatic artery. No appreciable about 9 or invasion of the portal vein, SMV, or splenic vein. Variant hepatic artery anatomy: Conventional (no variant), arising from the celiac trunk ultimately. Bile Duct Involvement: Absent Variant biliary anatomy: No Adjacent Nodes: 1.9 cm in short axis porta hepatis lymph node just below the portal vein, likely malignant. Omental/Peritoneal Disease: Not observed Distant Metastases: Yes (liver). No dilatation of the dorsal pancreatic duct. Spleen:  Unremarkable Adrenals/Urinary Tract: Adrenal glands unremarkable. Complex Bosniak category  58F cyst of the right kidney lower pole without appreciable enhancement but given the degree of calcification in septation this likely warrants surveillance. Stomach/Bowel: As noted above, the pancreatic mass abuts the duodenal bulb and descending duodenum. There also appears to be a small incidental lipoma in the  descending duodenum near the ampulla. Vascular/Lymphatic:  Aortoiliac atherosclerotic vascular disease. Other:  No supplemental non-categorized findings. Musculoskeletal: Lumbar spondylosis and degenerative disc disease. IMPRESSION: 1. Pancreatic head mass with extensive metastatic disease to the liver. Overall appearance favors pancreatic ductal adenocarcinoma with extensive metastatic disease to the liver and associated pathologic adenopathy in the porta hepatis. Other characteristics are as described above; please note that CT abdomen using pancreatic protocol provides for better spatial resolution and is typically the primary exam performed in the setting of new diagnosis of pancreatic cancer. This should likely be coordinated with the oncology service and does not need to be performed as an emergency examination. 2. Despite efforts by the technologist and patient, motion artifact is present on today's exam and could not be eliminated. This reduces exam sensitivity and specificity. 3.  Aortic Atherosclerosis (ICD10-I70.0). 4. Bosniak category 58F cyst of the right kidney lower pole. Typical guidelines would call for follow up imaging in 6 months time; it may be that the patient's surveillance imaging related to the expected pancreatic malignancy will be adequate to follow up this complex but probably benign cystic lesion. Electronically Signed   By: Van Clines M.D.   On: 09/25/2017 21:18    Labs:  CBC: Recent Labs    09/25/17 1628 09/26/17 1245  WBC 8.7 8.0  HGB 12.7* 11.5*  HCT 37.2* 35.2*  PLT 264 231    COAGS: Recent Labs    09/27/17 0419  INR 1.20    BMP: Recent Labs    09/25/17 1628 09/26/17 0415 09/26/17 1245  NA 141 140  --   K 4.8 4.5  --   CL 105 108  --   CO2 26 24  --   GLUCOSE 124* 101*  --   BUN 23* 19  --   CALCIUM 9.3 8.6*  --   CREATININE 1.48* 1.23 1.29*  GFRNONAA 39* 49* 46*  GFRAA 45* 57* 53*    LIVER FUNCTION TESTS: Recent Labs     09/25/17 1628  BILITOT 1.0  AST 51*  ALT 30  ALKPHOS 232*  PROT 7.7  ALBUMIN 3.7    TUMOR MARKERS: No results for input(s): AFPTM, CEA, CA199, CHROMGRNA in the last 8760 hours.  Assessment and Plan: Pancreatic mass with liver lesions. Plan for US guided biopsy liver lesion Labs pending Risks and benefits discussed with the patient including, but not limited to bleeding, infection, damage to adjacent structures or low yield requiring additional tests. All of the patient's questions were answered, patient is agreeable to proceed. Consent signed and in chart.    Thank you for this interesting consult.  I greatly enjoyed meeting Cinsere Mizrahi and look forward to participating in their care.  A copy of this report was sent to the requesting provider on this date.  Electronically Signed: Ascencion Dike, PA-C 10/12/2017, 12:03 PM   I spent a total of 20 minutes in face to face in clinical consultation, greater than 50% of which was counseling/coordinating care for liver biopsy

## 2017-10-13 ENCOUNTER — Telehealth: Payer: Self-pay

## 2017-10-13 NOTE — Telephone Encounter (Addendum)
Patient daughter called regarding patient stools stating, "they are still tar black". Also stated that patient hasn't been eating much and is "drinking a little". Returned call and daughter states that she is unsure of the frequency of the stools because "he won't let me in when he uses the bathroom" and she only knows that his stools are still black because "he had an accident and I had to clean it up". She also states that patient has not been taking his Protonix "precribed by Dr. Carlis Abbott for his stools". Daughter wanted to know if Dr. Benay Spice had any recommendations for additional medication. Advised daughter that it would be best to have patient take Protonix as prescribed. Daughter voiced understanding. MD made aware.   Spoke with patient daughter. Per Dr. Benay Spice, if patient is still having dark stools and unchanged symptoms he should go to the ER. Daughter voiced understanding and stated "i'll keep my eye on him".

## 2017-10-15 ENCOUNTER — Telehealth: Payer: Self-pay

## 2017-10-15 ENCOUNTER — Inpatient Hospital Stay: Payer: Medicare Other | Attending: Oncology | Admitting: Oncology

## 2017-10-15 VITALS — BP 135/60 | HR 93 | Temp 98.2°F | Resp 17 | Wt 126.2 lb

## 2017-10-15 DIAGNOSIS — R630 Anorexia: Secondary | ICD-10-CM | POA: Insufficient documentation

## 2017-10-15 DIAGNOSIS — F0390 Unspecified dementia without behavioral disturbance: Secondary | ICD-10-CM | POA: Insufficient documentation

## 2017-10-15 DIAGNOSIS — D649 Anemia, unspecified: Secondary | ICD-10-CM

## 2017-10-15 DIAGNOSIS — C787 Secondary malignant neoplasm of liver and intrahepatic bile duct: Secondary | ICD-10-CM

## 2017-10-15 DIAGNOSIS — C250 Malignant neoplasm of head of pancreas: Secondary | ICD-10-CM | POA: Insufficient documentation

## 2017-10-15 DIAGNOSIS — G893 Neoplasm related pain (acute) (chronic): Secondary | ICD-10-CM | POA: Diagnosis not present

## 2017-10-15 DIAGNOSIS — N189 Chronic kidney disease, unspecified: Secondary | ICD-10-CM | POA: Insufficient documentation

## 2017-10-15 DIAGNOSIS — K8689 Other specified diseases of pancreas: Secondary | ICD-10-CM

## 2017-10-15 MED ORDER — ALPRAZOLAM 0.5 MG PO TABS: 0.5000 mg | ORAL_TABLET | Freq: Three times a day (TID) | ORAL | 0 refills | Status: AC | PRN

## 2017-10-15 MED ORDER — HYDROCODONE-ACETAMINOPHEN 7.5-325 MG PO TABS: 1.0000 | ORAL_TABLET | Freq: Four times a day (QID) | ORAL | 0 refills | Status: AC | PRN

## 2017-10-15 MED ORDER — POLYETHYLENE GLYCOL 3350 17 G PO PACK
17.0000 g | PACK | Freq: Every day | ORAL | 0 refills | Status: AC
Start: 2017-10-15 — End: ?

## 2017-10-15 MED ORDER — PREDNISONE 20 MG PO TABS: 20.0000 mg | ORAL_TABLET | Freq: Every day | ORAL | 0 refills | Status: AC

## 2017-10-15 NOTE — Telephone Encounter (Signed)
Received call from patient daughter. States that her father "declined the biopsy" and that he is "too sick and weak" to make his appt today. She states patient is "barely eating". Also made request for a change in pain pills.  Returned call. Per MD, patient can reschedule to Monday or be referred to Hospice. Patient daughter states "you cant just fix his body?" Informed that the best thing to do is try to make it in for the visit to speak with Dr. Benay Spice. Daughter voiced understanding and said "we'll be there". MD made aware. Knows to call with any further issues.

## 2017-10-15 NOTE — Progress Notes (Signed)
[facility] OFFICE PROGRESS NOTE   Diagnosis: Pancreas cancer  INTERVAL HISTORY:   Mr. Bobrowski returns with multiple family members.  They report he has severe anorexia.  Abdominal pain is relieved with hydrocodone.  OxyContin caused hallucinations.  He is taking hydrocodone every 4 hours.  He is having bowel movements.  His daughter has witnessed a "tarry "bowel movement. He underwent an ultrasound-guided biopsy of a liver lesion on 10/12/2017. 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  The family report he is sometimes agitated. Objective:  Vital signs in last 24 hours:  Blood pressure 135/60, pulse 93, temperature 98.2 F (36.8 C), temperature source Oral, resp. rate 17, weight 126 lb 3.2 oz (57.2 kg), SpO2 100 %.    Resp: Lungs clear bilaterally Cardio: Regular rate and rhythm GI: Fullness in the right subcostal region with associated tenderness Vascular: No leg edema Neuro: Alert, follows commands, has to be redirected in order to answer questions     Lab Results:  Lab Results  Component Value Date   WBC 8.7 10/12/2017   HGB 9.0 (L) 10/12/2017   HCT 27.8 (L) 10/12/2017   MCV 92.7 10/12/2017   PLT 304 10/12/2017   NEUTROABS 5.6 09/25/2017    Imaging:  US Biopsy (liver)  Result Date: 10/12/2017 CLINICAL DATA:  Pancreatic mass.  Multiple liver lesions. EXAM: ULTRASOUND GUIDED CORE BIOPSY OF LIVER LESION MEDICATIONS: Intravenous Fentanyl and Versed were administered as conscious sedation during continuous monitoring of the patient's level of consciousness and physiological / cardiorespiratory status by the radiology RN, with a total moderate sedation time of 10 minutes. PROCEDURE: The procedure, risks, benefits, and alternatives were explained to the patient. Questions regarding the procedure were encouraged and answered. The patient understands and consents to the procedure. Survey ultrasound of the liver was  performed. A representative lesion was localized and appropriate skin entry site determined and marked. The operative field was prepped with chlorhexidine in a sterile fashion, and a sterile drape was applied covering the operative field. A sterile gown and sterile gloves were used for the procedure. Local anesthesia was provided with 1% Lidocaine. Under real-time ultrasound guidance, a 17 gauge trocar needle was advanced to the margin of the lesion. Once needle tip position was confirmed, coaxial 18-gauge core biopsy samples were obtained, submitted in formalin to surgical pathology. The guide needle was removed. Postprocedure scans show no hemorrhage or other apparent complication. The patient tolerated the procedure well. COMPLICATIONS: None. FINDINGS: Multiple hypoechoic and isoechoic liver lesions were identified corresponding to findings on recent CT and MRI. Representative core biopsy samples obtained as above. IMPRESSION: 1. Technically successful ultrasound-guided core biopsy, liver lesion. Electronically Signed   By: Lucrezia Europe M.D.   On: 10/12/2017 14:11    Medications: I have reviewed the patient's current medications.   Assessment/Plan: 1. Metastatic pancreas cancer ? CT abdomen and MRI abdomen December 2018-pancreas head mass, multiple liver lesions, porta hepatis adenopathy ? Ultrasound core biopsy of a liver lesion 10/12/2017-adenocarcinoma consistent with a pancreaticobiliary primary  2. Anorexia/weight loss  3.   Pain secondary to #1  4.   Chronic renal insufficiency  5.   Anemia-likely secondary to GI bleeding  6.   Dementia  Disposition: Mr. Luellen has been diagnosed with metastatic pancreas cancer.  No therapy will be curative.  I discussed the poor prognosis with Mr. Barrell and his family.  He appears to have a difficult time understanding his prognosis.  He is not a candidate for systemic  chemotherapy based on his age, altered mental status, and poor performance  status.  I recommend Hospice care.  He and his family are in agreement.  He is symptomatic with pain, general weakness, and anorexia.  I suspect the GI bleeding is related to involvement of the GI tract with tumor.  We discussed an endoscopic evaluation and transfusion support.  I do not recommend this.  His family is in agreement.  He will discontinue aspirin.  He will begin low-dose prednisone as an appetite stimulant.  We prescribed Xanax to use as needed for agitation.  He will continue hydrocodone for pain.  I explained the hospice team may recommend adding a different long-acting narcotic if he continues to require frequent hydrocodone.  We will make a [facility] hospice referral today.  Mr. Mccollam may be a candidate for [employer].  I discussed his limited expected life span with his daughter.  I estimate his survival to be measured in weeks.  25 minutes were spent with the patient today.  The majority of the time was used for counseling and coordination of care.  Betsy Coder, MD  10/15/2017  2:30 PM

## 2017-10-18 ENCOUNTER — Telehealth: Payer: Self-pay | Admitting: Oncology

## 2017-10-18 ENCOUNTER — Encounter: Payer: Self-pay | Admitting: Nutrition

## 2017-10-18 ENCOUNTER — Encounter: Payer: Medicare Other | Admitting: Nutrition

## 2017-10-18 NOTE — Progress Notes (Signed)
Blanch Media cancelled patient appointment.Patient under Hospice care. She requested nutrition information be mailed to her PO box. I provided handouts and coupons to Joyce's address.

## 2017-10-18 NOTE — Telephone Encounter (Signed)
Spoke with patient dtr re 1/18 f/u.

## 2017-10-20 ENCOUNTER — Telehealth: Payer: Self-pay | Admitting: *Deleted

## 2017-10-20 ENCOUNTER — Emergency Department (HOSPITAL_COMMUNITY)
Admission: EM | Admit: 2017-10-20 | Discharge: 2017-10-21 | Disposition: A | Discharge status: Home / Self Care | Attending: Emergency Medicine | Admitting: Emergency Medicine

## 2017-10-20 ENCOUNTER — Encounter (HOSPITAL_COMMUNITY): Payer: Self-pay | Admitting: *Deleted

## 2017-10-20 DIAGNOSIS — Z79899 Other long term (current) drug therapy: Secondary | ICD-10-CM | POA: Insufficient documentation

## 2017-10-20 DIAGNOSIS — K920 Hematemesis: Secondary | ICD-10-CM | POA: Diagnosis not present

## 2017-10-20 DIAGNOSIS — I129 Hypertensive chronic kidney disease with stage 1 through stage 4 chronic kidney disease, or unspecified chronic kidney disease: Secondary | ICD-10-CM | POA: Diagnosis not present

## 2017-10-20 DIAGNOSIS — Z7982 Long term (current) use of aspirin: Secondary | ICD-10-CM | POA: Insufficient documentation

## 2017-10-20 DIAGNOSIS — N183 Chronic kidney disease, stage 3 (moderate): Secondary | ICD-10-CM | POA: Insufficient documentation

## 2017-10-20 DIAGNOSIS — R531 Weakness: Secondary | ICD-10-CM | POA: Diagnosis present

## 2017-10-20 MED ORDER — MORPHINE SULFATE (PF) 4 MG/ML IV SOLN
4.0000 mg | Freq: Once | INTRAVENOUS | Status: AC
  Administered 2017-10-20: 4 mg via INTRAVENOUS
  Filled 2017-10-20: qty 1

## 2017-10-20 MED ORDER — MORPHINE SULFATE 20 MG/5ML PO SOLN: 5.0000 mg | ORAL | 0 refills | Status: AC | PRN

## 2017-10-20 MED ORDER — ONDANSETRON HCL 4 MG/2ML IJ SOLN
4.0000 mg | Freq: Once | INTRAMUSCULAR | Status: AC
  Administered 2017-10-20: 4 mg via INTRAVENOUS
  Filled 2017-10-20: qty 2

## 2017-10-20 MED ORDER — SODIUM CHLORIDE 0.9 % IV BOLUS (SEPSIS)
2000.0000 mL | Freq: Once | INTRAVENOUS | Status: AC
  Administered 2017-10-20: 2000 mL via INTRAVENOUS

## 2017-10-20 MED ORDER — HALOPERIDOL LACTATE 5 MG/ML IJ SOLN: 0.5000 mg | INTRAMUSCULAR | Status: DC | PRN

## 2017-10-20 MED ORDER — GLYCOPYRROLATE 1 MG PO TABS
1.0000 mg | ORAL_TABLET | ORAL | Status: DC | PRN
  Filled 2017-10-20: qty 1

## 2017-10-20 MED ORDER — ONDANSETRON 4 MG PO TBDP: 4.0000 mg | ORAL_TABLET | Freq: Four times a day (QID) | ORAL | Status: DC | PRN

## 2017-10-20 MED ORDER — ACETAMINOPHEN 650 MG RE SUPP: 650.0000 mg | Freq: Four times a day (QID) | RECTAL | Status: DC | PRN

## 2017-10-20 MED ORDER — BIOTENE DRY MOUTH MT LIQD: 15.0000 mL | OROMUCOSAL | Status: DC | PRN

## 2017-10-20 MED ORDER — HALOPERIDOL LACTATE 2 MG/ML PO CONC
0.5000 mg | ORAL | Status: DC | PRN
  Filled 2017-10-20: qty 0.3

## 2017-10-20 MED ORDER — MORPHINE BOLUS VIA INFUSION
2.0000 mg | INTRAVENOUS | Status: DC | PRN
  Filled 2017-10-20: qty 2

## 2017-10-20 MED ORDER — GLYCOPYRROLATE 0.2 MG/ML IJ SOLN
0.2000 mg | INTRAMUSCULAR | Status: DC | PRN
Start: 2017-10-20 — End: 2017-10-21
  Filled 2017-10-20: qty 1

## 2017-10-20 MED ORDER — MIRTAZAPINE 30 MG PO TABS: 15.0000 mg | ORAL_TABLET | Freq: Every day | ORAL | Status: DC

## 2017-10-20 MED ORDER — ONDANSETRON HCL 4 MG/2ML IJ SOLN
4.0000 mg | Freq: Four times a day (QID) | INTRAMUSCULAR | Status: DC | PRN
  Administered 2017-10-20: 4 mg via INTRAVENOUS
  Filled 2017-10-20: qty 2

## 2017-10-20 MED ORDER — ALPRAZOLAM 0.5 MG PO TABS: 0.5000 mg | ORAL_TABLET | Freq: Three times a day (TID) | ORAL | Status: DC | PRN

## 2017-10-20 MED ORDER — ACETAMINOPHEN 325 MG PO TABS: 650.0000 mg | ORAL_TABLET | Freq: Four times a day (QID) | ORAL | Status: DC | PRN

## 2017-10-20 MED ORDER — SODIUM CHLORIDE 0.9 % IV SOLN
5.0000 mg/h | INTRAVENOUS | Status: DC
  Administered 2017-10-21: 5 mg/h via INTRAVENOUS
  Filled 2017-10-20: qty 10

## 2017-10-20 MED ORDER — ONDANSETRON HCL 4 MG/2ML IJ SOLN
INTRAMUSCULAR | Status: AC
  Administered 2017-10-20: 4 mg via INTRAVENOUS
  Filled 2017-10-20: qty 2

## 2017-10-20 MED ORDER — LORAZEPAM 2 MG/ML PO CONC: 1.0000 mg | ORAL | 0 refills | Status: AC | PRN

## 2017-10-20 MED ORDER — GLYCOPYRROLATE 0.2 MG/ML IJ SOLN
0.2000 mg | INTRAMUSCULAR | Status: DC | PRN
  Filled 2017-10-20: qty 1

## 2017-10-20 MED ORDER — ONDANSETRON HCL 4 MG/2ML IJ SOLN
4.0000 mg | Freq: Once | INTRAMUSCULAR | Status: AC
  Administered 2017-10-20: 4 mg via INTRAVENOUS

## 2017-10-20 MED ORDER — METOCLOPRAMIDE HCL 5 MG/ML IJ SOLN
10.0000 mg | Freq: Once | INTRAMUSCULAR | Status: AC
  Administered 2017-10-20: 10 mg via INTRAVENOUS

## 2017-10-20 MED ORDER — HALOPERIDOL 1 MG PO TABS: 0.5000 mg | ORAL_TABLET | ORAL | Status: DC | PRN

## 2017-10-20 MED ORDER — MORPHINE SULFATE (PF) 2 MG/ML IV SOLN: 2.0000 mg | INTRAVENOUS | Status: DC | PRN

## 2017-10-20 MED ORDER — POLYVINYL ALCOHOL 1.4 % OP SOLN
1.0000 [drp] | Freq: Four times a day (QID) | OPHTHALMIC | Status: DC | PRN
  Filled 2017-10-20: qty 15

## 2017-10-20 MED ORDER — MORPHINE SULFATE (PF) 4 MG/ML IV SOLN
2.0000 mg | INTRAVENOUS | Status: DC | PRN
Start: 2017-10-20 — End: 2017-10-21
  Administered 2017-10-20: 2 mg via INTRAVENOUS
  Filled 2017-10-20: qty 1

## 2017-10-20 MED ORDER — ONDANSETRON 8 MG PO TBDP: 8.0000 mg | ORAL_TABLET | Freq: Three times a day (TID) | ORAL | 0 refills | Status: AC | PRN

## 2017-10-20 NOTE — ED Provider Notes (Signed)
6:48 PM Patient is having worsening pain despite q1 hr morphine.  Will place on a morphine drip and give as needed morphine as well.   Sherwood Gambler, MD 10/20/17 [phone]

## 2017-10-20 NOTE — Progress Notes (Addendum)
[facility] Liaison:  RN visit  UPDATE:  Per Dr. Venora Maples, patient now with vomiting (old blood).  Inquiring about potential Stage manager.  I have contacted [facility] CSW to come talk with family.   Met with patient, son and niece at bedside.  Dr. Venora Maples was also in the room.  Patient voiced that he doesn't want to come in to the hospital.  Family concurs that patient doesn't want to come back and forth to hospital, and that they want him home.  Educated on hospice services, and when to call hospice.  Son did not want to further talk about [place].  He advised that [facility], will be here soon to see patient.    Per Dr. Venora Maples, plan is to hydrate patient with IV fluids and send home.  We will continue to follow patient while hospitalized.   Thank you,  Edyth Gunnels, RN, BSN [facility] Liaison [phone]  All hospital liaisons are now on [place].

## 2017-10-20 NOTE — Progress Notes (Signed)
Consult request has been received. CSW attempting to follow up at present time. EDP consulted with CSW then stated CSW visit unnecessary.  EDP will provide pt's family with options at this time to let family know other options are in place should family need Hospice Care.  Per EDP, [facility] was full 10/20/17.  Please reconsult if future social work needs arise.  CSW signing off, as social work intervention is no longer needed.  Alphonse Guild. Larissa Pegg, LCSW, LCAS, CSI Clinical Social Worker Ph: [phone]

## 2017-10-20 NOTE — ED Provider Notes (Signed)
[facility] DEPT Provider Note   CSN: [id number] Arrival date & time: 10/20/17  1116     History   Chief Complaint Chief Complaint  Patient presents with  . Loss of Consciousness    HPI Jerry Berry is a 84 y.o. male.  HPI Patient is a 84 year old with advanced metastatic pancreatic cancer.  He presents the emergency department with weakness and a syncopal episode.  He vomited dark red blood here in the emergency department.  He sensitive.  He appears to be actively dying from his metastatic pancreatic cancer.  [facility] is already involved.  Patient is focused on comfort and quality at this point.  Family is agreeable.   Past Medical History:  Diagnosis Date  . Abdominal desmoid tumor   . Arthritis    "aching in shoulders, center of lower back" (05/29/2016)  . Bronchitis   . Chronic kidney disease    /notes 05/29/2016  . History of lower GI bleeding   . Hypertension   . Prostate pain   . Ulcerative colitis [facility])     Patient Active Problem List   Diagnosis Date Noted  . Dehydration 09/25/2017  . Chest pain 05/29/2016  . Diverticular hemorrhage 05/31/2014  . Orthostatic hypotension 05/29/2014  . Syncope and collapse 05/28/2014  . Acute post-hemorrhagic anemia 05/28/2014  . Essential hypertension, benign 05/28/2014  . Stage III chronic kidney disease ([place]) 05/28/2014  . BRBPR (bright red blood per rectum) 05/27/2014  . CKD (chronic kidney disease) stage 3, GFR 30-59 ml/min (HCC) 05/27/2014  . CAP (community acquired pneumonia) 02/13/2012  . Fever and chills 02/13/2012  . ARF (acute renal failure) ([place]) 02/13/2012  . BPH (benign prostatic hyperplasia) 02/13/2012  . HYPERTENSION, UNSPECIFIED 02/20/2010  . CHEST PAIN 02/20/2010    Past Surgical History:  Procedure Laterality Date  . COLONOSCOPY N/A 05/30/2014   Procedure: COLONOSCOPY;  Surgeon: Milus Banister, MD;  Location: [facility];  Service: Endoscopy;   Laterality: N/A;  . TOOTH EXTRACTION         Home Medications    Prior to Admission medications   Medication Sig Start Date End Date Taking? Authorizing Provider  ALPRAZolam Duanne Moron) 0.5 MG tablet Take 1 tablet (0.5 mg total) by mouth every 8 (eight) hours as needed. for agitation 10/15/17  Yes Ladell Pier, MD  aspirin 81 MG chewable tablet Chew 1 tablet (81 mg total) by mouth at bedtime. Restart in 1 week 05/31/14  Yes Domenic Polite, MD  GLUCERNA [facility]) LIQD Take 237 mLs by mouth 2 (two) times daily as needed.   Yes [provider]  HYDROcodone-acetaminophen (NORCO) 7.5-325 MG tablet Take 1 tablet by mouth every 6 (six) hours as needed. 10/15/17  Yes Ladell Pier, MD  latanoprost (XALATAN) 0.005 % ophthalmic solution Place 1 drop into both eyes at bedtime.   Yes [provider]  metoprolol succinate (TOPROL-XL) 25 MG 24 hr tablet take 1/2 tablet by mouth at bedtime 03/03/17  Yes Larey Dresser, MD  mirtazapine (REMERON) 15 MG tablet Take 15 mg by mouth at bedtime. 08/31/17  Yes [provider]  pantoprazole (PROTONIX) 40 MG tablet Take 40 mg by mouth every evening. 08/31/17  Yes [provider]  polyethylene glycol (MIRALAX) packet Take 17 g by mouth daily. 10/15/17  Yes Ladell Pier, MD  predniSONE (DELTASONE) 20 MG tablet Take 1 tablet (20 mg total) by mouth daily with breakfast. 10/15/17  Yes Ladell Pier, MD  Tamsulosin HCl (FLOMAX) 0.4  MG CAPS Take 0.4 mg by mouth at bedtime.   Yes [provider]  timolol (TIMOPTIC-XR) 0.5 % ophthalmic gel-forming Place 1 drop into both eyes daily. 07/29/17  Yes [provider]  Trolamine Salicylate (ASPERCREME EX) Apply 21 application topically daily as needed (arthritis).   Yes [provider]  LORazepam (ATIVAN) 2 MG/ML concentrated solution Take 0.5 mLs (1 mg total) by mouth every 4 (four) hours as needed for anxiety. 10/20/17   Jola Schmidt, MD  morphine 20 MG/5ML  solution Take 1.3 mLs (5.2 mg total) by mouth every 2 (two) hours as needed for pain. 10/20/17   Jola Schmidt, MD  ondansetron (ZOFRAN ODT) 8 MG disintegrating tablet Take 1 tablet (8 mg total) by mouth every 8 (eight) hours as needed for nausea or vomiting. 10/20/17   Jola Schmidt, MD    Family History Family History  Problem Relation Age of Onset  . Heart disease Mother   . Diabetes Mother   . Parkinson's disease Father   . Cancer Sister        colon  . Cancer Brother        stomach  . Cancer Sister        bone  . Cancer Sister     Social History Social History   Tobacco Use  . Smoking status: Never Smoker  . Smokeless tobacco: Never Used  Substance Use Topics  . Alcohol use: Yes    Alcohol/week: 4.2 oz    Types: 7 Cans of beer per week  . Drug use: No     Allergies   Oxycontin [oxycodone hcl]   Review of Systems Review of Systems  All other systems reviewed and are negative.    Physical Exam Updated Vital Signs BP (!) 75/41 Comment: [name]  Pulse (!) 121   Temp (!) 97.5 F (36.4 C) (Oral)   Resp (!) 30   SpO2 100%   Physical Exam  Constitutional: He is oriented to person, place, and time. He appears well-developed.  Chronically ill-appearing  HENT:  Head: Normocephalic.  Eyes: EOM are normal.  Neck: Normal range of motion.  Cardiovascular:  Tachycardic  Pulmonary/Chest: Effort normal and breath sounds normal.  Abdominal: Soft. He exhibits no distension. There is no tenderness.  Musculoskeletal: Normal range of motion.  Neurological: He is alert and oriented to person, place, and time.  Psychiatric: He has a normal mood and affect.  Nursing note and vitals reviewed.    ED Treatments / Results  Labs (all labs ordered are listed, but only abnormal results are displayed) Labs Reviewed - No data to display  EKG  EKG Interpretation None       Radiology No results found.  Procedures Procedures (including critical care  time)  Medications Ordered in ED Medications  acetaminophen (TYLENOL) tablet 650 mg (not administered)    Or  acetaminophen (TYLENOL) suppository 650 mg (not administered)  haloperidol (HALDOL) tablet 0.5 mg (not administered)    Or  haloperidol (HALDOL) 2 MG/ML solution 0.5 mg (not administered)    Or  haloperidol lactate (HALDOL) injection 0.5 mg (not administered)  ondansetron (ZOFRAN-ODT) disintegrating tablet 4 mg (not administered)    Or  ondansetron (ZOFRAN) injection 4 mg (not administered)  glycopyrrolate (ROBINUL) tablet 1 mg (not administered)    Or  glycopyrrolate (ROBINUL) injection 0.2 mg (not administered)    Or  glycopyrrolate (ROBINUL) injection 0.2 mg (not administered)  antiseptic oral rinse (BIOTENE) solution 15 mL (not administered)  polyvinyl alcohol (LIQUIFILM TEARS)  1.4 % ophthalmic solution 1 drop (not administered)  morphine 4 MG/ML injection 2 mg (not administered)  sodium chloride 0.9 % bolus 2,000 mL (0 mLs Intravenous Stopped 10/20/17 1618)  metoCLOPramide (REGLAN) injection 10 mg (10 mg Intravenous Given 10/20/17 1429)  ondansetron (ZOFRAN) injection 4 mg (4 mg Intravenous Given 10/20/17 1429)  morphine 4 MG/ML injection 4 mg (4 mg Intravenous Given 10/20/17 1514)     Initial Impression / Assessment and Plan / ED Course  I have reviewed the triage vital signs and the nursing notes.  Pertinent labs & imaging results that were available during my care of the patient were reviewed by me and considered in my medical decision making (see chart for details).     5:56 PM With multiple discussions with multiple family members the initial plan was to be discharged home with ongoing home hospice care including home with oral morphine, Zofran, oral benzodiazepine.  Family however now has changed her mind and feels uncomfortable taking him home tonight.  [facility] has [facility] seen the patient.  He will be observed overnight for patient comfort reasons  and will need to be reevaluated in the morning by the hospice of [facility] team to see if a bed becomes available at [facility].  Family understands that there is a strong possibility that the patient may not make it through the evening as he continues to have dark stools at this time and is obviously anemic and hypotensive.    Final Clinical Impressions(s) / ED Diagnoses   Final diagnoses:  Hematemesis with nausea    ED Discharge Orders        Ordered    morphine 20 MG/5ML solution  Every 2 hours PRN     10/20/17 1735    ondansetron (ZOFRAN ODT) 8 MG disintegrating tablet  Every 8 hours PRN     10/20/17 1735    LORazepam (ATIVAN) 2 MG/ML concentrated solution  Every 4 hours PRN     10/20/17 1735       Jola Schmidt, MD 10/20/17 1757

## 2017-10-20 NOTE — ED Triage Notes (Signed)
Per EMS, pt from home had syncopal episode. Pt's son witnessed episode. Pt did not fall, pt denies injury. Pt was 60/40 when EMS arrived. Pt was positive for orthostatic BP changes.  Pt denies any other complaints at this time.   Pt received 1 liter bolus en route.  BP 120/60 HR 90 RR 16 SpO2 99% on 4L [facility] 139

## 2017-10-20 NOTE — ED Notes (Signed)
Family at bedside. 

## 2017-10-20 NOTE — ED Notes (Signed)
Bed: WA17 Expected date: 10/20/17 Expected time: 11:00 AM Means of arrival: Ambulance Comments: 84 yo resolved hypotension

## 2017-10-20 NOTE — ED Notes (Signed)
Pt is heavily bleeding from his rectum and is loudly moaning, pt medicated, dr Venora Maples notified

## 2017-10-20 NOTE — ED Notes (Signed)
ED Provider at bedside. 

## 2017-10-20 NOTE — Telephone Encounter (Signed)
Per Dr. Benay Spice: OK to cancel office visit if pt is being seen by hospice. Noted pt went to ED today, called hospice to confirm they are following pt. YES. They will have nurse call office after visit on 1/17 if pt is discharged from ED.

## 2017-10-20 NOTE — ED Notes (Signed)
ED Provider at bedside.  Dr. Venora Maples

## 2017-10-20 NOTE — ED Notes (Signed)
Bed: WA27 Expected date:  Expected time:  Means of arrival:  Comments: Rm 17

## 2017-10-21 ENCOUNTER — Telehealth: Payer: Self-pay

## 2017-10-21 ENCOUNTER — Telehealth: Payer: Self-pay | Admitting: *Deleted

## 2017-10-22 ENCOUNTER — Ambulatory Visit: Payer: Medicare Other | Admitting: Oncology

## 2017-11-05 NOTE — Progress Notes (Addendum)
[facility] Liaison:  RN visit  Spoke with step grandson this morning, who was at bedside with patient.  I advised that we do have a [employer] bed available for transfer.  He refused bed.  He contacted his mother, Blanch Media (step daughter to the patient and decision maker), who agreed for patient to go home.  Dr. Vanita Panda contacted her directly and she expressed that patient would want to be home and they want to honor his wishes.  Plan is for patient to be discharged to home and to travel by [facility].  I have made patient's [facility] team aware.   Family has refused any equipment for the home and states "we just want to get him there".   If you have any hospice related questions, please feel free to contact me directly.  Edyth Gunnels, RN, BSN [facility] Liaison [phone]  All hospital liaisons are now on [place].

## 2017-11-05 NOTE — ED Notes (Addendum)
This RN notified by Edyth Gunnels, RN for [facility] that peripheral IV and morphine drip would be discontinued at pt residence by [place] with a witness present.

## 2017-11-05 NOTE — ED Notes (Addendum)
Pt family contacted to verify that pt peripheral iv was still in place with morphine infusion clamped. Pt family stated that the peripheral iv was still in place and no medication was infusing. Pt also reported that EMS instructed them not to touch anything involving the pt medications and that the hospice nurse would take over the pt care when she arrived. During this conversation family stated that they were waiting for the [facility] RN to arrive to their home.

## 2017-11-05 NOTE — Telephone Encounter (Signed)
Received call from Rickey Barbara, RN to inform that Jerry Berry April 14, 2022 passed away today at 1:46pm. Dr. Benay Spice notified.

## 2017-11-05 NOTE — ED Notes (Signed)
Edyth Gunnels, RN for [facility] notified regarding the contact information for the hospice nurse that would be taking over the care for the pt. Amy, RN informed of the situation regarding the morphine infusion and peripheral iv in place. After the RN spoke with her supervisor and informed her of the situation, Amy called and informed this RN that the situation would be taken care of.

## 2017-11-05 NOTE — Telephone Encounter (Signed)
"  Diane with [facility] [place]. Calling to make dr. Benay Spice aware this patient went to the ED yesterday.  Was started on a morphine.  Morphine drip was not discontinued,  He was a full Code.  Discharged today on drip ha expired in the home today."  Routing call information to collaborative nurse and provider for review.  Further patient communication through collaborative nurse.

## 2017-11-05 NOTE — Progress Notes (Signed)
CSW spoke with Edyth Gunnels with [facility] to determine if facility will have any beds available for today. Amy to reach out to facility social worker and contact CSW with update.   Kingsley Spittle, [facility] Emergency Room Clinical Social Worker [phone]

## 2017-11-05 NOTE — ED Provider Notes (Addendum)
10:05 AM Patient preparing for transport home. His case has been discussed extensively with hospice, and I discussed his discharge with his daughter who affirms that the patient has requested multiple times to be sent home so he can die in his own domicile. Patient was designated as DNR yesterday, after conversation between her, and our colleagues, and hospice services.   Carmin Muskrat, MD 2017/11/19 1006  It is not clear that the patient himself did not want to be DNR, though he did not want any interventions either. This was ascertained after discussing the patient's previously stated verbal wishes with his daughter, who is unwilling to rescind his directive. However, his request for discharge remains, and absent any therapeutic options for his cancer, and after discussion with hospice care, he will return home.    Carmin Muskrat, MD 2017/11/19 1024

## 2017-11-05 DEATH — deceased

## 2018-03-31 IMAGING — US US BIOPSY CORE LIVER
1 series · 11 of 11 positions shown · non-contrast
Comparison: none

CLINICAL DATA: Pancreatic mass.  Multiple liver lesions.

[Series 1: us biopsy core liver · 0.23mm/px · 11 of 11 slices shown]
[im 1/11]
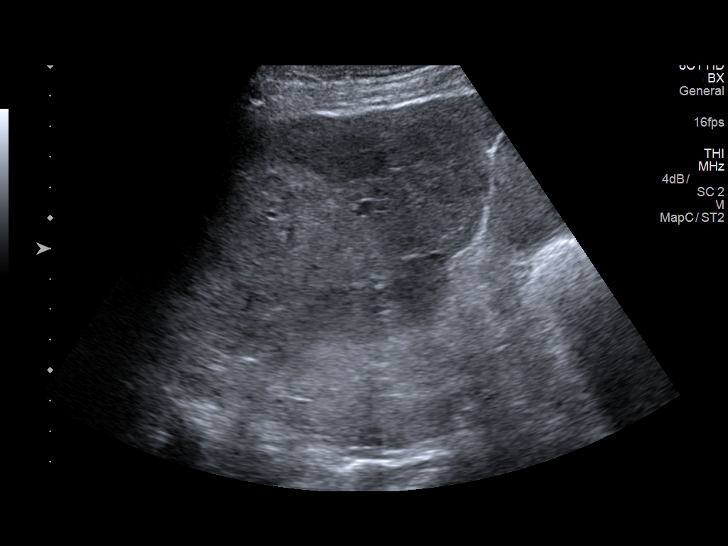
[im 2/11]
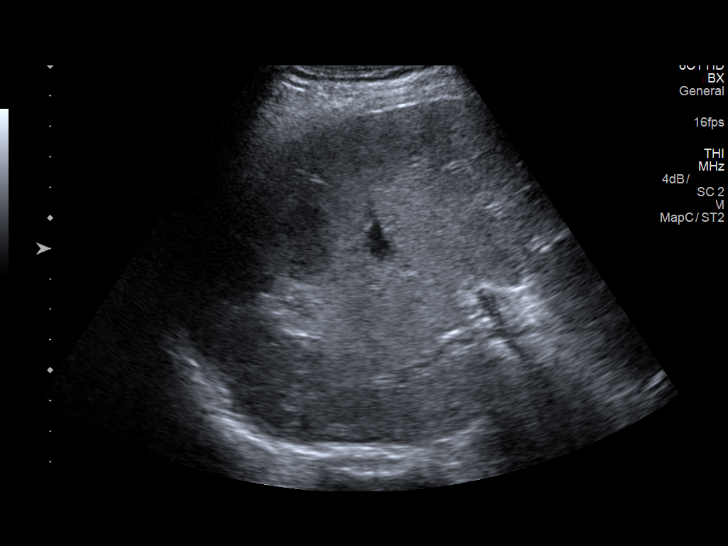
[im 3/11]
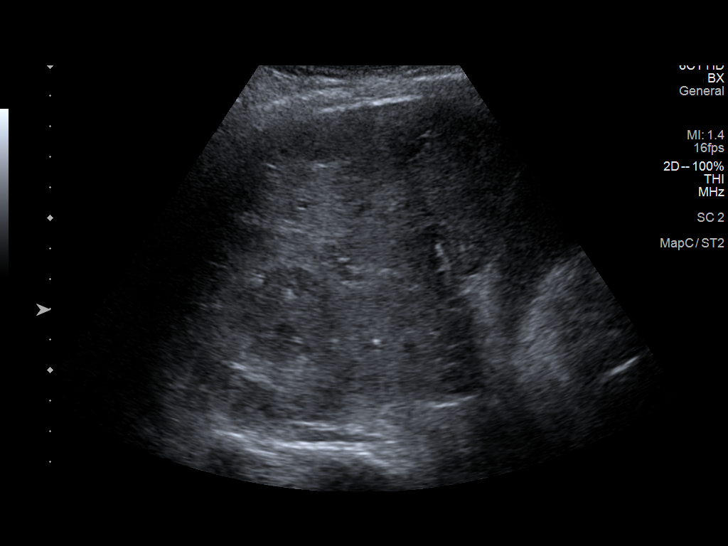
[im 4/11]
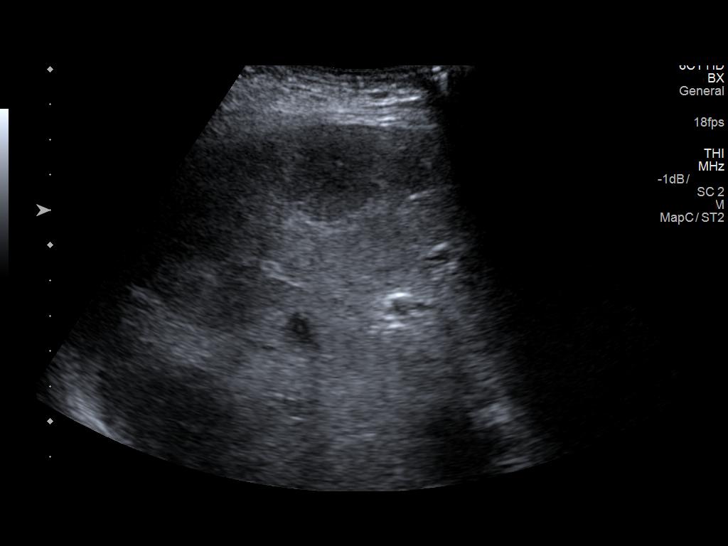
[im 5/11]
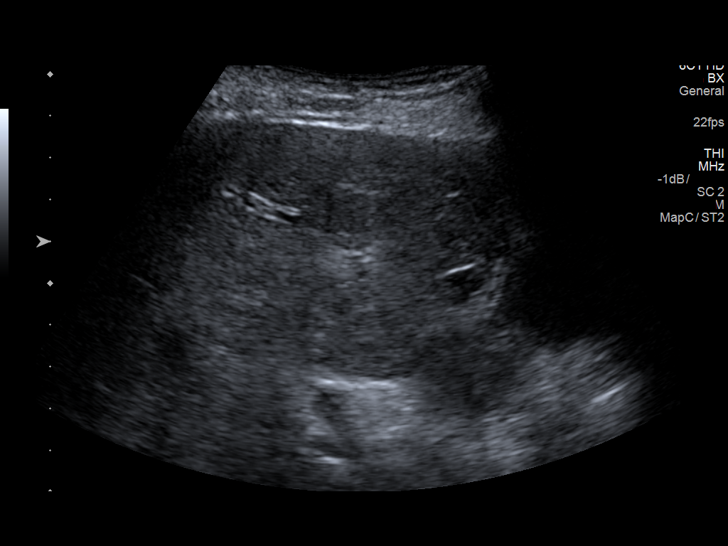
[im 6/11]
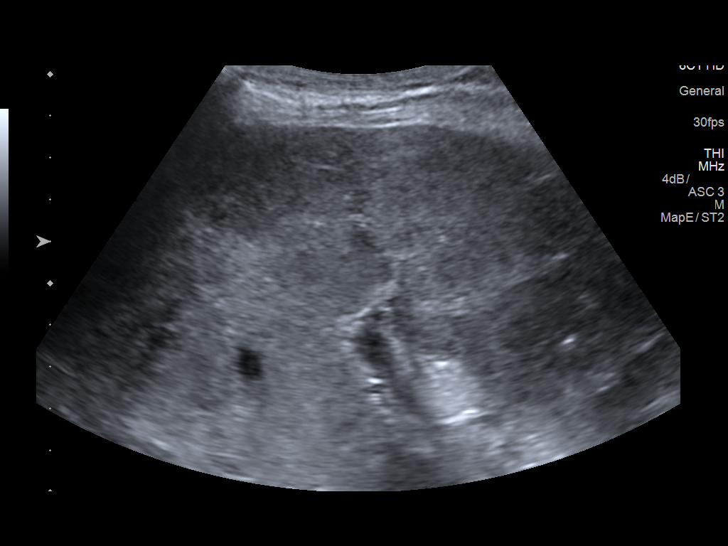
[im 7/11]
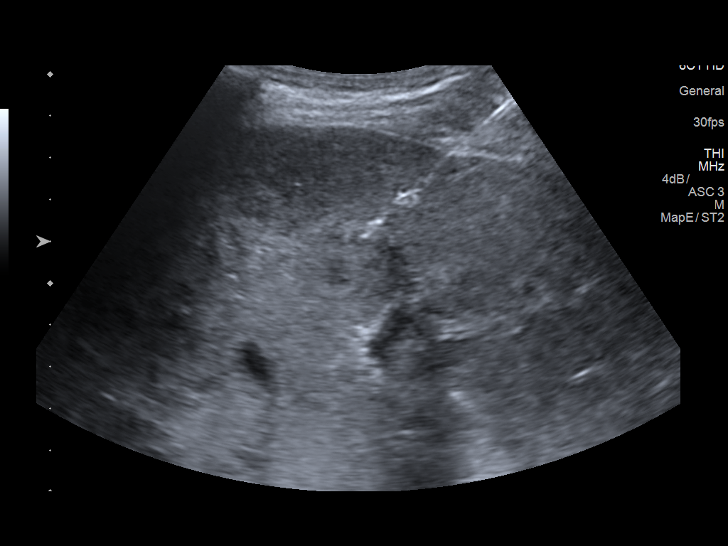
[im 8/11]
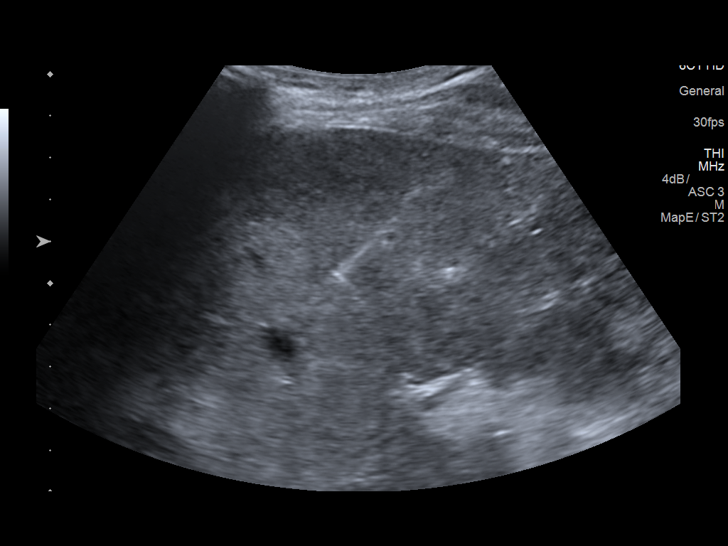
[im 9/11]
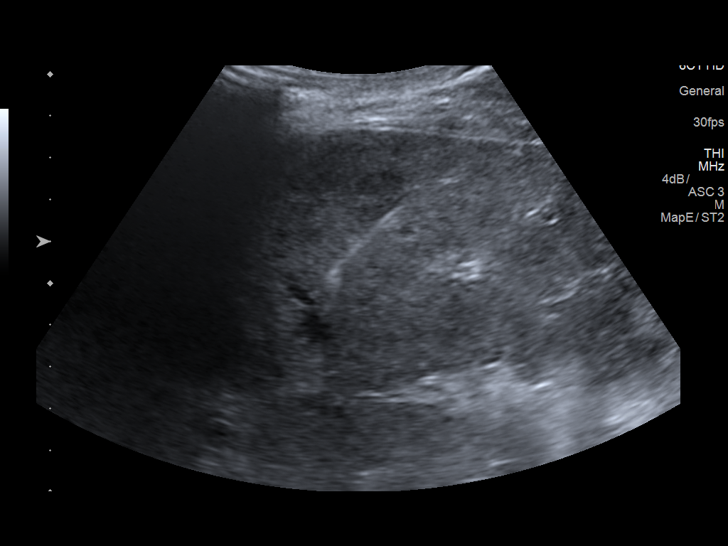
[im 10/11]
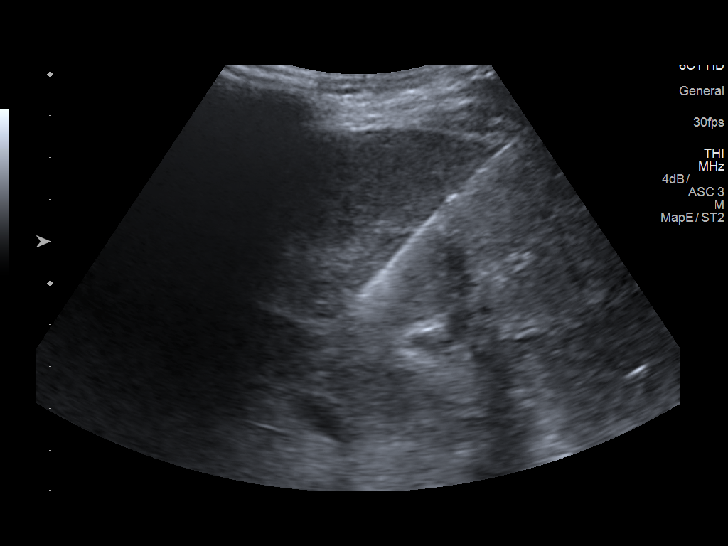
[im 11/11]
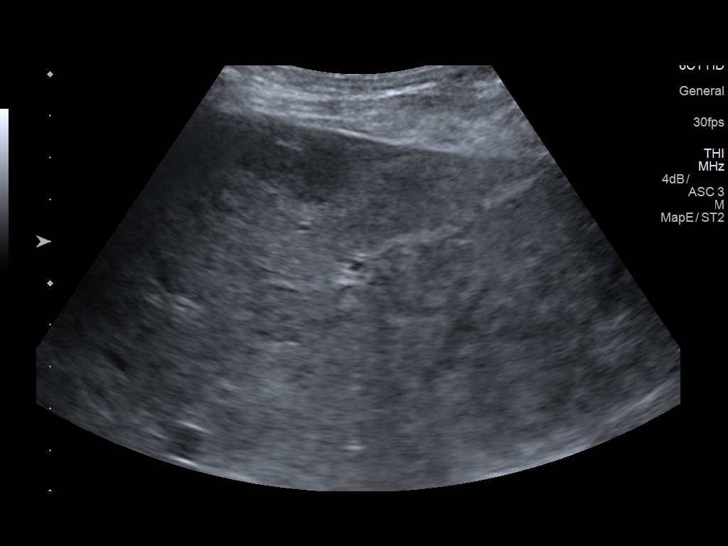

[11 of 11 positions shown; findings below may reference images not displayed]

EXAM:
ULTRASOUND GUIDED CORE BIOPSY OF LIVER LESION

MEDICATIONS:
Intravenous Fentanyl and Versed were administered as conscious
sedation during continuous monitoring of the patient's level of
consciousness and physiological / cardiorespiratory status by the
radiology RN, with a total moderate sedation time of 10 minutes.

PROCEDURE:
The procedure, risks, benefits, and alternatives were explained to
the patient. Questions regarding the procedure were encouraged and
answered. The patient understands and consents to the procedure.

Survey ultrasound of the liver was performed. A representative
lesion was localized and appropriate skin entry site determined and
marked.

The operative field was prepped with chlorhexidine in a sterile
fashion, and a sterile drape was applied covering the operative
field. A sterile gown and sterile gloves were used for the
procedure. Local anesthesia was provided with 1% Lidocaine.

Under real-time ultrasound guidance, a 17 gauge trocar needle was
advanced to the margin of the lesion. Once needle tip position was
confirmed, coaxial 18-gauge core biopsy samples were obtained,
submitted in formalin to surgical pathology. The guide needle was
removed. Postprocedure scans show no hemorrhage or other apparent
complication. The patient tolerated the procedure well.

COMPLICATIONS:
None.
FINDINGS: Multiple hypoechoic and isoechoic liver lesions were identified
corresponding to findings on recent CT and MRI. Representative core
biopsy samples obtained as above.
IMPRESSION: 1. Technically successful ultrasound-guided core biopsy, liver
lesion.
